# Patient Record
Sex: Male | Born: 1990
Health system: Southern US, Community
[De-identification: ages and names within clinical notes are randomized; demographics above are authoritative.]

## PROBLEM LIST (undated history)

## (undated) DIAGNOSIS — F419 Anxiety disorder, unspecified: Secondary | ICD-10-CM

## (undated) DIAGNOSIS — F32A Depression, unspecified: Secondary | ICD-10-CM

## (undated) DIAGNOSIS — F329 Major depressive disorder, single episode, unspecified: Secondary | ICD-10-CM

## (undated) DIAGNOSIS — K219 Gastro-esophageal reflux disease without esophagitis: Secondary | ICD-10-CM

## (undated) HISTORY — DX: Gastro-esophageal reflux disease without esophagitis: K21.9

---

## 2015-04-13 ENCOUNTER — Encounter (HOSPITAL_COMMUNITY): Payer: Self-pay | Admitting: *Deleted

## 2015-04-13 ENCOUNTER — Inpatient Hospital Stay (HOSPITAL_COMMUNITY)
Admission: AD | Admit: 2015-04-13 | Discharge: 2015-04-16 | DRG: 885 | Disposition: A | Discharge status: Home / Self Care | Payer: No Typology Code available for payment source | Source: Intra-hospital | Attending: Psychiatry | Admitting: Psychiatry

## 2015-04-13 ENCOUNTER — Encounter (HOSPITAL_COMMUNITY): Payer: Self-pay | Admitting: Emergency Medicine

## 2015-04-13 ENCOUNTER — Emergency Department (HOSPITAL_COMMUNITY)
Admission: EM | Admit: 2015-04-13 | Discharge: 2015-04-13 | Disposition: A | Discharge status: Other Acute Inpatient Hospital | Payer: Self-pay | Attending: Emergency Medicine | Admitting: Emergency Medicine

## 2015-04-13 DIAGNOSIS — F1210 Cannabis abuse, uncomplicated: Secondary | ICD-10-CM | POA: Insufficient documentation

## 2015-04-13 DIAGNOSIS — F329 Major depressive disorder, single episode, unspecified: Secondary | ICD-10-CM | POA: Insufficient documentation

## 2015-04-13 DIAGNOSIS — F1310 Sedative, hypnotic or anxiolytic abuse, uncomplicated: Secondary | ICD-10-CM | POA: Insufficient documentation

## 2015-04-13 DIAGNOSIS — F32A Depression, unspecified: Secondary | ICD-10-CM

## 2015-04-13 DIAGNOSIS — Z720 Tobacco use: Secondary | ICD-10-CM | POA: Insufficient documentation

## 2015-04-13 DIAGNOSIS — F1290 Cannabis use, unspecified, uncomplicated: Secondary | ICD-10-CM | POA: Diagnosis present

## 2015-04-13 DIAGNOSIS — F319 Bipolar disorder, unspecified: Secondary | ICD-10-CM | POA: Diagnosis present

## 2015-04-13 DIAGNOSIS — R45851 Suicidal ideations: Secondary | ICD-10-CM | POA: Diagnosis present

## 2015-04-13 DIAGNOSIS — F419 Anxiety disorder, unspecified: Secondary | ICD-10-CM | POA: Insufficient documentation

## 2015-04-13 DIAGNOSIS — F314 Bipolar disorder, current episode depressed, severe, without psychotic features: Secondary | ICD-10-CM | POA: Diagnosis not present

## 2015-04-13 DIAGNOSIS — F332 Major depressive disorder, recurrent severe without psychotic features: Secondary | ICD-10-CM | POA: Diagnosis present

## 2015-04-13 DIAGNOSIS — F17210 Nicotine dependence, cigarettes, uncomplicated: Secondary | ICD-10-CM | POA: Diagnosis present

## 2015-04-13 DIAGNOSIS — F3130 Bipolar disorder, current episode depressed, mild or moderate severity, unspecified: Secondary | ICD-10-CM | POA: Diagnosis not present

## 2015-04-13 HISTORY — DX: Depression, unspecified: F32.A

## 2015-04-13 HISTORY — DX: Major depressive disorder, single episode, unspecified: F32.9

## 2015-04-13 HISTORY — DX: Anxiety disorder, unspecified: F41.9

## 2015-04-13 LAB — COMPREHENSIVE METABOLIC PANEL
ALBUMIN: 3.9 g/dL (ref 3.5–5.0)
ALT: 12 U/L — ABNORMAL LOW (ref 17–63)
ANION GAP: 5 (ref 5–15)
AST: 16 U/L (ref 15–41)
Alkaline Phosphatase: 52 U/L (ref 38–126)
BUN: 9 mg/dL (ref 6–20)
CO2: 27 mmol/L (ref 22–32)
CREATININE: 1.08 mg/dL (ref 0.61–1.24)
Calcium: 8.9 mg/dL (ref 8.9–10.3)
Chloride: 106 mmol/L (ref 101–111)
GFR calc non Af Amer: >60 mL/min (ref >60)
GLUCOSE: 102 mg/dL — AB (ref 65–99)
Potassium: 3.6 mmol/L (ref 3.5–5.1)
Sodium: 138 mmol/L (ref 135–145)
TOTAL PROTEIN: 6.2 g/dL — AB (ref 6.5–8.1)
Total Bilirubin: 0.8 mg/dL (ref 0.3–1.2)

## 2015-04-13 LAB — CBC
HCT: 42.7 % (ref 39.0–52.0)
Hemoglobin: 14.6 g/dL (ref 13.0–17.0)
MCH: 30.7 pg (ref 26.0–34.0)
MCHC: 34.2 g/dL (ref 30.0–36.0)
MCV: 89.7 fL (ref 78.0–100.0)
Platelets: 177 10*3/uL (ref 150–400)
RBC: 4.76 MIL/uL (ref 4.22–5.81)
RDW: 13.2 % (ref 11.5–15.5)
WBC: 5.7 10*3/uL (ref 4.0–10.5)

## 2015-04-13 LAB — RAPID URINE DRUG SCREEN, HOSP PERFORMED
Amphetamines: NOT DETECTED
BARBITURATES: NOT DETECTED
Benzodiazepines: POSITIVE — AB
Cocaine: NOT DETECTED
Opiates: NOT DETECTED
Tetrahydrocannabinol: POSITIVE — AB

## 2015-04-13 LAB — ETHANOL: Alcohol, Ethyl (B): <5 mg/dL (ref <5)

## 2015-04-13 LAB — ACETAMINOPHEN LEVEL: Acetaminophen (Tylenol), Serum: <10 ug/mL — ABNORMAL LOW (ref 10–30)

## 2015-04-13 LAB — SALICYLATE LEVEL: Salicylate Lvl: <4 mg/dL (ref 2.8–30.0)

## 2015-04-13 MED ORDER — ENSURE ENLIVE PO LIQD: 237.0000 mL | Freq: Two times a day (BID) | ORAL | Status: DC

## 2015-04-13 MED ORDER — ACETAMINOPHEN 325 MG PO TABS: 650.0000 mg | ORAL_TABLET | Freq: Four times a day (QID) | ORAL | Status: DC | PRN

## 2015-04-13 MED ORDER — ALUM & MAG HYDROXIDE-SIMETH 200-200-20 MG/5ML PO SUSP: 30.0000 mL | ORAL | Status: DC | PRN

## 2015-04-13 MED ORDER — TEMAZEPAM 15 MG PO CAPS
15.0000 mg | ORAL_CAPSULE | Freq: Every evening | ORAL | Status: DC | PRN
  Administered 2015-04-14 – 2015-04-15 (×2): 15 mg via ORAL
  Filled 2015-04-13 (×2): qty 1

## 2015-04-13 MED ORDER — MAGNESIUM HYDROXIDE 400 MG/5ML PO SUSP: 30.0000 mL | Freq: Every day | ORAL | Status: DC | PRN

## 2015-04-13 MED ORDER — HYDROXYZINE HCL 25 MG PO TABS
25.0000 mg | ORAL_TABLET | Freq: Four times a day (QID) | ORAL | Status: DC | PRN
  Administered 2015-04-14: 25 mg via ORAL
  Filled 2015-04-13: qty 1

## 2015-04-13 MED ORDER — NICOTINE 21 MG/24HR TD PT24
21.0000 mg | MEDICATED_PATCH | TRANSDERMAL | Status: DC
  Filled 2015-04-13 (×3): qty 1

## 2015-04-13 MED ORDER — TRAZODONE HCL 50 MG PO TABS
50.0000 mg | ORAL_TABLET | Freq: Every evening | ORAL | Status: DC | PRN
  Administered 2015-04-14: 50 mg via ORAL
  Filled 2015-04-13: qty 1

## 2015-04-13 NOTE — Progress Notes (Signed)
Patient ID: Martin Stephens, male   DOB: [birth year], 24 y.o.   MRN: [id number] Admission:   Patient admitted for depression, SI and THC addiction. Pt [place] and felt like [place]. Pt reports no previous suicide attempts. Reports daily use of two bowls of marijuana which helps with  anxiety and sleep. Pt denies any previous mental health or SA treatment. Pt stated current stressors are conflict with his girlfriend, financial, work stress, and poor family support.Patient states he has anger issues when provoked and stated  'I will go after someone if they strike me wrong. I have had 5 assault charges". Also reports property destruction. Patient pleasant during admission. Affect depressed. Patient reports crying spells.Patient oriented to unit. Nourishment offered.

## 2015-04-13 NOTE — ED Notes (Signed)
Pt. wanded by security, staffing called for sitter, not availiable until 1100. Changed into scrubs.

## 2015-04-13 NOTE — Progress Notes (Signed)
Adult Psychoeducational Group Note  Date:  04/13/2015 Time:  9:18 PM  Group Topic/Focus:    Participation Level:    Participation Quality:    Affect:    Cognitive:    Insight:   Engagement in Group:    Modes of Intervention:    Additional Comments:  Patient did not attend group.  Natasha Mead 04/13/2015, 9:18 PM

## 2015-04-13 NOTE — ED Provider Notes (Signed)
CSN: [id number]     Arrival date & time 04/13/15  4098 History   First MD Initiated Contact with Patient 04/13/15 [phone]     Chief Complaint  Patient presents with  . Suicidal  . Depression      HPI  Patient presents for evaluation of depression and suicidal thoughts after [place]  He reports a history of anger control issues dating back to high school". He states he's been more labile with just the last few months. Has been more depressed. States she is normally considerable stress about work, money, and his relationship. He is a long-term relationship with his daughters mother. Works as a Games developer. He states he wanted to try to take today yesterday to "get away". [place]. He got up to the ledge. States he did not drive to the intent of hurting himself. However, while there he considered jumping in thinking "would end it all it might not hurt". He presents here for evaluation. He has never been evaluated or treated for mental health disorders including depression or anxiety.  History reviewed. No pertinent past medical history. History reviewed. No pertinent past surgical history. No family history on file. History  Substance Use Topics  . Smoking status: Current Every Day Smoker  . Smokeless tobacco: Not on file  . Alcohol Use: No    Review of Systems  Constitutional: Negative for fever, chills, diaphoresis, appetite change and fatigue.  HENT: Negative for mouth sores, sore throat and trouble swallowing.   Eyes: Negative for visual disturbance.  Respiratory: Negative for cough, chest tightness, shortness of breath and wheezing.   Cardiovascular: Negative for chest pain.  Gastrointestinal: Negative for nausea, vomiting, abdominal pain, diarrhea and abdominal distention.  Endocrine: Negative for polydipsia, polyphagia and polyuria.  Genitourinary: Negative for dysuria, frequency and  hematuria.  Musculoskeletal: Negative for gait problem.  Skin: Negative for color change, pallor and rash.  Neurological: Negative for dizziness, syncope, light-headedness and headaches.  Hematological: Does not bruise/bleed easily.  Psychiatric/Behavioral: Positive for suicidal ideas and dysphoric mood. Negative for behavioral problems and confusion. The patient is nervous/anxious.       Allergies  Review of patient's allergies indicates no known allergies.  Home Medications   Prior to Admission medications   Medication Sig Start Date End Date Taking? Authorizing Provider  Vitamins A & D (VITAMIN A & D) ointment Apply 1 application topically as needed for dry skin (tattoo care).   Yes Historical Provider, MD   BP 105/63 mmHg  Pulse 60  Temp(Src) 97.9 F (36.6 C) (Oral)  Resp 18  Ht 6' (1.829 m)  Wt 175 lb (79.379 kg)  BMI 23.73 kg/m2  SpO2 98% Physical Exam  Constitutional: He is oriented to person, place, and time. He appears well-developed and well-nourished. No distress.  HENT:  Head: Normocephalic.  Eyes: Conjunctivae are normal. Pupils are equal, round, and reactive to light. No scleral icterus.  Neck: Normal range of motion. Neck supple. No thyromegaly present.  Cardiovascular: Normal rate and regular rhythm.  Exam reveals no gallop and no friction rub.   No murmur heard. Pulmonary/Chest: Effort normal and breath sounds normal. No respiratory distress. He has no wheezes. He has no rales.  Abdominal: Soft. Bowel sounds are normal. He exhibits no distension. There is no tenderness. There is no rebound.  Musculoskeletal: Normal range of motion.  Neurological: He is alert and oriented to person, place, and  time.  Skin: Skin is warm and dry. No rash noted.  Psychiatric: He has a normal mood and affect. His behavior is normal.    ED Course  Procedures (including critical care time) Labs Review Labs Reviewed  COMPREHENSIVE METABOLIC PANEL - Abnormal; Notable for the  following:    Glucose, Bld 102 (*)    Total Protein 6.2 (*)    ALT 12 (*)    All other components within normal limits  ACETAMINOPHEN LEVEL - Abnormal; Notable for the following:    Acetaminophen (Tylenol), Serum <10 (*)    All other components within normal limits  ETHANOL  SALICYLATE LEVEL  CBC  URINE RAPID DRUG SCREEN, HOSP PERFORMED    Imaging Review No results found.   EKG Interpretation None      MDM   Final diagnoses:  Depression    Patient undergoing TTS evaluation by [facility] staff currently.    Rolland Porter, MD 04/13/15 1005

## 2015-04-13 NOTE — Tx Team (Signed)
Initial Interdisciplinary Treatment Plan   PATIENT STRESSORS: Financial difficulties Legal issue Substance abuse   PATIENT STRENGTHS: Ability for insight Capable of independent living Communication skills General fund of knowledge Physical Health   PROBLEM LIST: Problem List/Patient Goals Date to be addressed Date deferred Reason deferred Estimated date of resolution  "I came here because I need help, to talk" 04/16/15     "I am a pot head but I don't want to quit"                                                 DISCHARGE CRITERIA:  Ability to meet basic life and health needs Improved stabilization in mood, thinking, and/or behavior Motivation to continue treatment in a less acute level of care  PRELIMINARY DISCHARGE PLAN: Return to previous living arrangement Return to previous work or school arrangements  PATIENT/FAMIILY INVOLVEMENT: This treatment plan has been presented to and reviewed with the patient, Martin Stephens.  The patient has been given the opportunity to ask questions and make suggestions.  Loren Racer 04/13/2015, 3:58 PM

## 2015-04-13 NOTE — BH Assessment (Addendum)
Tele Assessment Note   Martin Stephens is an 24 y.o. male that is self-referred to [facility] this day.  Pt stated he has been having increased depression and SI with attempt.  Pt stated he went [place] yesterday, did not have intent to jump, but stated he contemplated jumping.  Pt reports no previous SI or attempts.  Pt continues to endorse SI and was crying throughout assessment.  Pt reports depressive sx including poor sleep, not eating in three days, problems functioning at work, increased anger and irritability, sadness, crying spells.  Pt reports anxiety, stating he has "constant worry about my family" and reports he shakes at times.  Pt denies HI or AVH.  No delusions noted.  Pt does report ongoig daily use of marijuana to help with anxiety and sleep.  Pt denies any previous mental health or SA treatment.  Pt stated current stressors are conflict with his girlfriend, financial, work stress, and poor family support.  Pt cooperative, crying throughout assessment, had good eye contact, was pleasant, oriented x 4, had logical/coherent thought processes, normal speech, good eye contact, depressed mood, appropriate affect.  Inpatient psychiatric treatment is recommended for the pt at this time.  Consulted with Dr. Lucianne Muss who accepted pt to [facility] pending bed availability.  Updated TTS staff and EDP Fayrene Fearing, who was in agreement with pt disposition.  Pt is voluntary and wants help.  Axis I: 296.33 Major Depressive Disorder, Recurrent, Severe, 304.30 Cannabis use disorder, Severe Axis II: Deferred Axis III:  Past Medical History  Diagnosis Date  . Depression   . Anxiety    Axis IV: occupational problems, other psychosocial or environmental problems, problems related to social environment, problems with access to health care services and problems with primary support group Axis V: 21-30 behavior considerably influenced by delusions or hallucinations OR serious impairment in judgment, communication OR  inability to function in almost all areas  Past Medical History:  Past Medical History  Diagnosis Date  . Depression   . Anxiety     History reviewed. No pertinent past surgical history.  Family History: No family history on file.  Social History:  reports that he has been smoking.  He does not have any smokeless tobacco history on file. He reports that he uses illicit drugs (Marijuana). He reports that he does not drink alcohol.  Additional Social History:  Alcohol / Drug Use Pain Medications: none Prescriptions: none Over the Counter: none History of alcohol / drug use?: Yes Longest period of sobriety (when/how long): na Negative Consequences of Use:  (na-pt denies) Withdrawal Symptoms:  (na) Substance #1 Name of Substance 1: Marijuana 1 - Age of First Use: 6th grade 1 - Amount (size/oz): 5 grams 1 - Frequency: daily 1 - Duration: ongoing 1 - Last Use / Amount: today-2 bowls  CIWA: CIWA-Ar BP: 105/63 mmHg Pulse Rate: 60 COWS:    PATIENT STRENGTHS: (choose at least two) Ability for insight Average or above average intelligence Capable of independent living Communication skills General fund of knowledge Motivation for treatment/growth Physical Health Work skills  Allergies: No Known Allergies  Home Medications:  (Not in a hospital admission)  OB/GYN Status:  No LMP for male patient.  General Assessment Data Location of Assessment: [facility] Services TTS Assessment: In system Is this a Tele or Face-to-Face Assessment?: Tele Assessment Is this an Initial Assessment or a Re-assessment for this encounter?: Initial Assessment Marital status: Single Maiden name:  (na) Is patient pregnant?:  (na) Pregnancy Status:  (  na) Living Arrangements: Spouse/significant other, Children Can pt return to current living arrangement?: Yes Admission Status: Voluntary Is patient capable of signing voluntary admission?: Yes Referral Source: Self/Family/Friend Insurance  type: None  Medical Screening Exam [facility] Walk-in ONLY) Medical Exam completed: No Reason for MSE not completed:  (na)  Crisis Care Plan Living Arrangements: Spouse/significant other, Children Name of Psychiatrist: none Name of Therapist: none  Education Status Is patient currently in school?: No Current Grade: na Highest grade of school patient has completed: college degree Name of school: na Contact person: na  Risk to self with the past 6 months Suicidal Ideation: Yes-Currently Present Has patient been a risk to self within the past 6 months prior to admission? : Yes Suicidal Intent: Yes-Currently Present Has patient had any suicidal intent within the past 6 months prior to admission? : Yes Is patient at risk for suicide?: Yes Suicidal Plan?: Yes-Currently Present Has patient had any suicidal plan within the past 6 months prior to admission? : Yes Specify Current Suicidal Plan: was going to [place] yesterday Access to Means: Yes Specify Access to Suicidal Means: was able [place] What has been your use of drugs/alcohol within the last 12 months?: pt report daily use of marijuana Previous Attempts/Gestures: No How many times?: 0 Other Self Harm Risks: na-pt denies Triggers for Past Attempts: None known Intentional Self Injurious Behavior: None Family Suicide History: No Recent stressful life event(s): Conflict (Comment), Financial Problems Persecutory voices/beliefs?: No Depression: Yes Depression Symptoms: Despondent, Insomnia, Tearfulness, Isolating, Fatigue, Loss of interest in usual pleasures, Feeling worthless/self pity, Feeling angry/irritable Substance abuse history and/or treatment for substance abuse?: No Suicide prevention information given to non-admitted patients: Not applicable  Risk to Others within the past 6 months Homicidal Ideation: No Does patient have any lifetime risk of violence toward others beyond the six months  prior to admission? : No Thoughts of Harm to Others: No Current Homicidal Intent: No Current Homicidal Plan: No Access to Homicidal Means: No Identified Victim: na-pt denies History of harm to others?: No Assessment of Violence: None Noted Violent Behavior Description: na-pt cooperative Does patient have access to weapons?: No Criminal Charges Pending?: No Does patient have a court date: No Is patient on probation?: No  Psychosis Hallucinations: None noted Delusions: None noted  Mental Status Report Appearance/Hygiene: Disheveled, In scrubs Eye Contact: Good Motor Activity: Freedom of movement, Unremarkable Speech: Logical/coherent Level of Consciousness: Quiet/awake, Crying Mood: Depressed Affect: Appropriate to circumstance Anxiety Level: Severe Thought Processes: Coherent, Relevant Judgement: Impaired Orientation: Person, Place, Time, Situation Obsessive Compulsive Thoughts/Behaviors: None  Cognitive Functioning Concentration: Decreased Memory: Recent Intact, Remote Intact IQ: Average Insight: Fair Impulse Control: Fair Appetite: Poor Weight Loss:  (unknown) Weight Gain: 0 Sleep: Decreased Total Hours of Sleep: 4 Vegetative Symptoms: None  ADLScreening [facility] Assessment Services) Patient's cognitive ability adequate to safely complete daily activities?: Yes Patient able to express need for assistance with ADLs?: Yes Independently performs ADLs?: Yes (appropriate for developmental age)  Prior Inpatient Therapy Prior Inpatient Therapy: No Prior Therapy Dates: na Prior Therapy Facilty/Provider(s): na Reason for Treatment: na  Prior Outpatient Therapy Prior Outpatient Therapy: No Prior Therapy Dates: na Prior Therapy Facilty/Provider(s): na Reason for Treatment: na Does patient have an ACCT team?: No Does patient have Intensive In-House Services?  : No Does patient have [facility] services? : No Does patient have P4CC services?: No  ADL Screening (condition  at time of admission) Patient's cognitive ability adequate to safely complete  daily activities?: Yes Is the patient deaf or have difficulty hearing?: No Does the patient have difficulty seeing, even when wearing glasses/contacts?: No Does the patient have difficulty concentrating, remembering, or making decisions?: No Patient able to express need for assistance with ADLs?: Yes Does the patient have difficulty dressing or bathing?: No Independently performs ADLs?: Yes (appropriate for developmental age) Does the patient have difficulty walking or climbing stairs?: No  Home Assistive Devices/Equipment Home Assistive Devices/Equipment: None    Abuse/Neglect Assessment (Assessment to be complete while patient is alone) Physical Abuse: Denies Verbal Abuse: Denies Sexual Abuse: Denies Exploitation of patient/patient's resources: Denies Self-Neglect: Denies Values / Beliefs Cultural Requests During Hospitalization: None Spiritual Requests During Hospitalization: None Consults Spiritual Care Consult Needed: No Social Work Consult Needed: No Merchant navy officer (For Healthcare) Does patient have an advance directive?: No Would patient like information on creating an advanced directive?: No - patient declined information    Additional Information 1:1 In Past 12 Months?: No CIRT Risk: No Elopement Risk: No Does patient have medical clearance?: Yes     Disposition:  Disposition Initial Assessment Completed for this Encounter: Yes Disposition of Patient: Referred to, Inpatient treatment program Type of inpatient treatment program: Adult  Casimer Lanius, MS, [facility] Therapeutic Triage Specialist [facility]   04/13/2015 10:37 AM

## 2015-04-13 NOTE — BH Assessment (Signed)
BHH Assessment Progress Note   Called and scheduled pt's tele assessment with this clinician as well as gathered clinical information from EDP James.  Casimer Lanius, MS, [facility] Therapeutic Triage Specialist [facility]

## 2015-04-13 NOTE — Progress Notes (Signed)
Patient has been accepted to [facility] to Dr. Adela Glimpse to Bed 400-2 Patient can be transported by [facility] after 2pm Report to be called:  [phone]  Patient needs to sign voluntary paperwork!  Deretha Emory, MSW Clinical Social Work: Emergency Room [phone]

## 2015-04-13 NOTE — ED Notes (Signed)
Secretary called states TTS is ready and to place computer in room.

## 2015-04-13 NOTE — Progress Notes (Signed)
Pt has been very agitated this evening since being brought on the unit earlier this afternoon.  When writer first spoke to pt, he was in the dayroom sitting off to himself with his arms crossed, watching TV.  Writer spent some time building a rapport with pt, talking about his situation at home and about being at [facility]  Writer took the time to explain the process of admission and discharge.  Pt very upset about being here and feels he was lied to at the ED.  He says his infant child is sick at home and his girlfriend is at work.  His mother is keeping the baby, but he does not feel his mother is good with the baby as she is bipolar and moody.  He says his father is also bipolar.  He is afraid he is developing a lot of their traits and he has a lot of anger issues that he is not proud of.  He denies having any suicidal thoughts at this time.  He denies HI/AVH.  He says he just needs to get out of here and return home.  As we talked, pt seemed to settle down.  He went to his room and lay down to rest.  That lasted for a short while until pt came out to make a phone call.  Apparently the person he was calling did not answer and he became very irate, slammed the phone, and began cursing, saying he needed to get out of here.  He became very loud in his cursing and was pacing back and forth.  This Clinical research associate and CN spoke with pt, trying to calm him down.  He remained angry and stormed off to his room.  Security and the [facility] were called to come speak to pt.  When staff entered his room, he was sitting on the bed looking out the window crying.  Staff continued to de-escalate pt and apologize for the misunderstanding of the admission process that he was told at the ED.  Writer continued to talk with pt after the other staff left the room.  Pt was offered something to help him relax and go to sleep.  He declined, but Clinical research associate called the NP to get something prn in case pt changed his mind.  At this time, pt is in bed asleep.  Safety  maintained with q15 minute checks.

## 2015-04-13 NOTE — Progress Notes (Signed)
Patient requested to sign a 72-hour discharge.  He was given the form and the process was explained to him.  Patient became angry and stated, "no matter what I have to stay here 3 days.  I just want to leave!"  Patient walked off to his room.  He returned to desk and asked for a nicotine patch.  Then he changed his mind and stated he wanted to take it after his shower.  Patient had towels and toiletries in his hand.  He stated, "there is going to be a problem if I don't get out of here."  Patient threw the towels and toiletries through the window and stormed off to his room.  Counselor and [facility] currently in room to talk with patient.

## 2015-04-13 NOTE — Progress Notes (Signed)
Per Julieanne Cotton, pt accepted to [facility] bed 400-2 by Dr. Jama Flavors. Can arrive after 2pm today. Admission is voluntary.  Ilean Skill, MSW, LCSWA Clinical Social Work, Disposition  04/13/2015 [phone]

## 2015-04-13 NOTE — ED Notes (Signed)
Pt ambulatory with steady gait on departure. VSS. A/O x4. No complaints. Leaving with [facility] transporter. Pt belongings with transporter.

## 2015-04-13 NOTE — ED Notes (Signed)
Placed computer in room for TTS.

## 2015-04-13 NOTE — ED Notes (Signed)
Pt. Requested that RN call his mother to update on his admission and status. Pt. Provided phone number. Mothers number added as emergency contact in demographics. Mother updated and made aware of visiting hours.

## 2015-04-13 NOTE — ED Notes (Signed)
Pt. Stated, my depression started about 4 months ago and it really got bad about 3 weeks ago.  Im depressed and yesterday i just wanted all of it to go away. My mind was just wondering, [place] for about 4 hours and I thought I could just jump and it wouldn't hurt.

## 2015-04-14 DIAGNOSIS — F314 Bipolar disorder, current episode depressed, severe, without psychotic features: Secondary | ICD-10-CM

## 2015-04-14 DIAGNOSIS — F3130 Bipolar disorder, current episode depressed, mild or moderate severity, unspecified: Secondary | ICD-10-CM | POA: Diagnosis present

## 2015-04-14 DIAGNOSIS — R45851 Suicidal ideations: Secondary | ICD-10-CM

## 2015-04-14 MED ORDER — LAMOTRIGINE 25 MG PO TABS
25.0000 mg | ORAL_TABLET | Freq: Every day | ORAL | Status: DC
  Administered 2015-04-14 – 2015-04-16 (×3): 25 mg via ORAL
  Filled 2015-04-14 (×6): qty 1

## 2015-04-14 MED ORDER — NICOTINE 21 MG/24HR TD PT24
21.0000 mg | MEDICATED_PATCH | TRANSDERMAL | Status: DC
  Filled 2015-04-14 (×5): qty 1

## 2015-04-14 NOTE — B[facility] Group Notes (Signed)
[facility] Group Notes:  (Nursing/MHT/Case Management/Adjunct)  Date:  04/14/2015  Time:  11:04 PM  Type of Therapy:  Psychoeducational Skills  Participation Level:  Minimal  Participation Quality:  Appropriate  Affect:  Flat  Cognitive:  Alert and Oriented  Insight:  Lacking  Engagement in Group:  Limited  Modes of Intervention:  Discussion and Education  Summary of Progress/Problems: Pt. States he is working on his anger and has no triggers just anything causes him to get angry but was resistant to coping skills.  States he just wants outpatient therapy.  Cooper Render 04/14/2015, 11:04 PM

## 2015-04-14 NOTE — [facility] Suicide Risk Assessment (Addendum)
[facility] Admission Suicide Risk Assessment   Nursing information obtained from:  Patient Demographic factors:  Male, Adolescent or young adult, Caucasian Current Mental Status:  Suicidal ideation indicated by patient Loss Factors:  Legal issues, Financial problems / change in socioeconomic status Historical Factors:  Family history of mental illness or substance abuse Risk Reduction Factors:  Responsible for children under 55 years of age, Sense of responsibility to family, Employed, Living with another person, especially a relative Total Time spent with patient: 45 minutes Principal Problem: Bipolar disorder Diagnosis:   Patient Active Problem List   Diagnosis Date Noted  . MDD (major depressive disorder), recurrent episode, severe [F33.2] 04/13/2015     Continued Clinical Symptoms:  Alcohol Use Disorder Identification Test Final Score (AUDIT): 0 The "Alcohol Use Disorders Identification Test", Guidelines for Use in Primary Care, Second Edition.  World Science writer [facility]). Score between 0-7:  no or low risk or alcohol related problems. Score between 8-15:  moderate risk of alcohol related problems. Score between 16-19:  high risk of alcohol related problems. Score 20 or above:  warrants further diagnostic evaluation for alcohol dependence and treatment.   CLINICAL FACTORS:   Bipolar Disorder:   Mixed State Depressive phase Unstable or Poor Therapeutic Relationship   Musculoskeletal: Strength & Muscle Tone: within normal limits Gait & Station: normal Patient leans: N/A  Psychiatric Specialty Exam: Physical Exam  ROS  Blood pressure 107/69, pulse 74, temperature 98.1 F (36.7 C), temperature source Oral, resp. rate 18, height 6\' 1"  (1.854 m), weight 75.751 kg (167 lb).Body mass index is 22.04 kg/(m^2).  General Appearance: Guarded  Eye Contact::  Fair  Speech:  Pressured  Volume:  Increased  Mood:  Angry, Anxious, Depressed and Irritable  Affect:  Constricted,  Depressed and Labile  Thought Process:  Intact  Orientation:  Full (Time, Place, and Person)  Thought Content:  Rumination  Suicidal Thoughts:  Yes.  with intent/plan  Homicidal Thoughts:  No  Memory:  Immediate;   Fair Recent;   Fair Remote;   Fair  Judgement:  Impaired  Insight:  Lacking  Psychomotor Activity:  Increased  Concentration:  Fair  Recall:  [employer] of Knowledge:Fair  Language: Good  Akathisia:  No  Handed:  Right  AIMS (if indicated):     Assets:  Financial Resources/Insurance Housing  Sleep:     Cognition: WNL  ADL's:  Intact     COGNITIVE FEATURES THAT CONTRIBUTE TO RISK:  Closed-mindedness, Loss of executive function and Polarized thinking    SUICIDE RISK:   Moderate:  Frequent suicidal ideation with limited intensity, and duration, some specificity in terms of plans, no associated intent, good self-control, limited dysphoria/symptomatology, some risk factors present, and identifiable protective factors, including available and accessible social support.  PLAN OF CARE: Patient is 24 year old Caucasian employed male who was admitted due to severe depression and having thoughts to kill himself.  Patient has plan to jump from hanging rock and he was contemplating to act on his plan.  Patient is irritable, labile and endorse that he has history of mood swings anger and agitation.  He reported a lot of psychosocial stressors including dealing with lawyer for custody battle for her girlfriend and financial stress.  Patient endorse father has bipolar disorder.  Staff informed the girlfriend is very concerned because patient sent text message about suicide.  Patient requires inpatient treatment and stabilization.  We will increase collateral information and consider mood stabilizer to help his anger and depression.  Patient  is positive for marijuana.  Medical Decision Making:  New problem, with additional work up planned, Review of Psycho-Social Stressors (1), Review or  order clinical lab tests (1), Review and summation of old records (2), Established Problem, Worsening (2), Review of Medication Regimen & Side Effects (2) and Review of New Medication or Change in Dosage (2)  I certify that inpatient services furnished can reasonably be expected to improve the patient's condition.   Shea Swalley T. 04/14/2015, 1:12 PM
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
## 2015-04-14 NOTE — [facility] Group Notes (Signed)
[facility] LCSW Group Therapy  04/14/2015 1:15 PM  Type of Therapy:  Group Therapy  Participation Level:  Active  Participation Quality:  Appropriate  Affect:  Appropriate  Cognitive:  Appropriate  Insight:  Developing/Improving  Engagement in Therapy:  Engaged  Modes of Intervention:  Discussion, Exploration, Rapport Building, Socialization and Support  Summary of Progress/Problems: The main focus of today's process group was to learn how to use a decisional balance exercise to move forward in the Stages of Change, which were described and discussed. Motivational Interviewing and the whiteboard were utilized to help patients explore in depth the perceived benefits and costs of unhealthy coping techniques, as well as the benefits and costs of replacing that with a healthy coping skills.Martin Stephens shared need for follow up with therapist which he reports he is highly motivated to follow up with this time reporting "I know I need help with my anger and am finally willing to talk with someone. I can't keep this up, I lash out inappropriately over nothing all the time." Patient also identified with self medication [facility]) and reports it is not problematic at this time.  Clide Dales 04/14/2015, 2:48 PM

## 2015-04-14 NOTE — Progress Notes (Signed)
Patient ID: Martin Stephens, male   DOB: 12-31-1990, 24 y.o.   MRN: [id number] D: Patient remains angry and agitated.  He spoke with MD and NP this morning and was angry because he was told he would not leave today.  Nicotine patch offered to patient, along with medication for anxiety.  Patient refused both.  Patient's finance called to ask if she could visit early.  Fiance stated that patient has hx of bipolar in his family and she feels he may be suffering from it also.  States that he will not take any medication.  Patient states that he smokes pot every day to self medicate.  He has isolated to room, electing to not attend group or go to the cafeteria.  He has minimal contact with staff and peers.  Patient states, "I don't need to be here."  Patient also signed 72 hour request for discharge today at 1345. A: Continue to monitor medication management and MD orders.  Safety checks completed every 15 minutes per protocol.  Offer encouragement and support as needed. R: Patient remains isolative and resistant to treatment.

## 2015-04-14 NOTE — Plan of Care (Signed)
Problem: Alteration in mood & ability to function due to Goal: LTG-Pt reports reduction in suicidal thoughts (Patient reports reduction in suicidal thoughts and is able to verbalize a safety plan for whenever patient is feeling suicidal)  Outcome: Progressing Patient denies any suicidal thoughts upon admission.

## 2015-04-14 NOTE — Progress Notes (Signed)
D.  Pt in dayroom on approach, denies complaints at this time.  Asked when he would next get his mood stabilizer.  Pt stated he didn't sleep last night so he wants his bed time medications as soon as possible tonight.  Denies SI/HI/hallucinations at this time.  Positive for evening wrap up group, interacting appropriately with peers on the unit.  A.  Support and encouragement offered, answered questions pertaining to medications.  R.  Pt remains safe on unit, will continue to monitor.

## 2015-04-14 NOTE — H&P (Signed)
Psychiatric Admission Assessment Adult  Patient Identification: Martin Stephens MRN:  [id number] Date of Evaluation:  04/14/2015 Chief Complaint:  "I have never had suicide cross my mind before."  Principal Diagnosis: Bipolar I disorder, most recent episode depressed Diagnosis:   Patient Active Problem List   Diagnosis Date Noted  . Bipolar I disorder, most recent episode depressed [F31.30] 04/14/2015  . MDD (major depressive disorder), recurrent episode, severe [F33.2] 04/13/2015   History of Present Illness::     Martin Stephens is a 24 year old male who presented to [facility] for evaluation of depression and suicidal thoughts yesterday after [place]. He reported a history of anger control issues dating back to high school. He stated he's been more labile with just the last few months. Has been more depressed. Stated dhe is normally considerable stress about work, money, and his relationship. He is a long-term relationship with his daughters mother. He works as a Engineer, building services. He states he wanted to try to take  yesterday to "get away". He went to [employer] and walked. He got up to the ledge per his report. States he did not drive to the intent of hurting himself. However, while there he considered jumping in thinking "would end it all it might not hurt".  He has never been evaluated or treated for mental health disorders including depression or anxiety. Since arriving to the adult [facility] unit the patient has been requesting discharge. Patient is insistent that being "here is just making me worse. I need to be with my son. I made a promise to take him swimming. I can got to counseling and achieve what I need. I want someone to talk with. I normally self medicate with marijuana but recently I ran out of money. It's the only thing that makes me feel normal. No I was not going to jump. I just went there to relax. I did not go up there to  the rock. I'm so upset that I can't see my son while I am here. I am more agitated than depressed. I mean I came here voluntarily and did not expect to come here. Yes you can talk to my father or my fiance. But I'm not really close to any family." During the psychiatric assessment the patient was very argumentative and spent time defending his use of marijuana. Patient is irritable, labile and endorse that he has history of mood swings anger and agitation. He reported a lot of psychosocial stressors including dealing with lawyer for custody battle for her girlfriend and financial stress. Patient endorsed father has bipolar disorder.Per patient's permission his girlfriend Martin Stephens was contacted for collateral information. She expressed concern that the patient had strong intention to commit suicide. She reported that [name] sent a text that indicated he had no reason to live and that she responded by sending a picture of her and son. She reported extreme periods of mood lability and problems with anger. The girlfriend reported that the patient actually left work to go to [employer] for the purpose of suicide attempt. [name] has signed his 48 hour request for discharge. He was informed that the medication Lamictal would be started and that he would need to be monitored for at least 72 hours. He has no insight into taking prescribed medication but rather endorses his marijuana use. Patient's UDS is positive for marijuana and benzo. He reports his mother gave him a xanax before to relax.  Elements:  Location:  mood instability, suicidal thoughts . Quality:  angry outbursts, feeling hopeless, suicidal thoughts . Severity:  Severe. Timing:  Last few weeks. Duration:  Chronic. Context:  numerous stressors, family history Bipolar, marijuana abuse. Associated Signs/Symptoms: Depression Symptoms:  depressed mood, anhedonia, insomnia, feelings of worthlessness/guilt, difficulty  concentrating, hopelessness, recurrent thoughts of death, suicidal thoughts with specific plan, anxiety, loss of energy/fatigue, disturbed sleep, decreased appetite, (Hypo) Manic Symptoms:  Impulsivity, Irritable Mood, Labiality of Mood, Anxiety Symptoms:  Excessive Worry, Psychotic Symptoms:  Denies PTSD Symptoms: Negative Total Time spent with patient: 1 hour  Past Medical History:  Past Medical History  Diagnosis Date  . Depression   . Anxiety    History reviewed. No pertinent past surgical history. Family History: History reviewed. No pertinent family history. Social History:  History  Alcohol Use No     History  Drug Use  . Yes  . Special: Marijuana    History   Social History  . Marital Status: Single    Spouse Name: N/A  . Number of Children: N/A  . Years of Education: N/A   Social History Main Topics  . Smoking status: Current Every Day Smoker  . Smokeless tobacco: Not on file  . Alcohol Use: No  . Drug Use: Yes    Special: Marijuana  . Sexual Activity: Not on file   Other Topics Concern  . None   Social History Narrative   Additional Social History:    Pain Medications: none Prescriptions: none Over the Counter: none Longest period of sobriety (when/how long): na Negative Consequences of Use: Financial, Personal relationships, Work / Youth worker Withdrawal Symptoms: Irritability, Agitation Name of Substance 1: Marijuana 1 - Age of First Use: 6th grade 1 - Amount (size/oz): 5 grams 1 - Frequency: daily 1 - Duration: ongoing 1 - Last Use / Amount: today-2 bowls                   Musculoskeletal: Strength & Muscle Tone: within normal limits Gait & Station: normal Patient leans: N/A  Psychiatric Specialty Exam: Physical Exam  Constitutional:  Physical exam findings reviewed from the [facility] and I concur with no noted exceptions.   Psychiatric: His mood appears anxious. His affect is angry and labile. His speech is rapid and/or  pressured. He is agitated and hyperactive. Cognition and memory are normal. He expresses impulsivity. He expresses suicidal ideation.    Review of Systems  Constitutional: Negative.   HENT: Negative.   Eyes: Negative.   Respiratory: Negative.   Cardiovascular: Negative.   Gastrointestinal: Negative.   Genitourinary: Negative.   Musculoskeletal: Negative.   Skin: Negative.   Neurological: Negative.   Endo/Heme/Allergies: Negative.   Psychiatric/Behavioral: Positive for depression, suicidal ideas and substance abuse (Positive for marijuana ). Negative for hallucinations and memory loss. The patient is nervous/anxious and has insomnia.     Blood pressure 107/69, pulse 74, temperature 98.1 F (36.7 C), temperature source Oral, resp. rate 18, height '6\' 1"'  (1.854 m), weight 75.751 kg (167 lb).Body mass index is 22.04 kg/(m^2).  General Appearance: Casual  Eye Contact::  Fair  Speech:  Pressured  Volume:  Increased  Mood:  Angry, Anxious, Depressed and Irritable  Affect:  Labile  Thought Process:  Goal Directed and Intact  Orientation:  Full (Time, Place, and Person)  Thought Content:  Rumination  Suicidal Thoughts:  Yes.  with intent/plan  Homicidal Thoughts:  No  Memory:  Immediate;   Fair Recent;   Fair Remote;  Fair  Judgement:  Impaired  Insight:  Lacking  Psychomotor Activity:  Increased and Restlessness  Concentration:  Fair  Recall:  [employer] of Knowledge:Good  Language: Good  Akathisia:  No  Handed:  Right  AIMS (if indicated):     Assets:  Communication Skills Desire for Improvement Housing Intimacy Leisure Time Physical Health Resilience Vocational/Educational  ADL's:  Intact  Cognition: WNL  Sleep:      Risk to Self: Is patient at risk for suicide?: Yes What has been your use of drugs/alcohol within the last 12 months?: Patient smokes 5 grams per day. Patient stated he smokes when he starts to feel nervous. Risk to Others:   Prior Inpatient Therapy:    Prior Outpatient Therapy:    Alcohol Screening: 1. How often do you have a drink containing alcohol?: Never 9. Have you or someone else been injured as a result of your drinking?: No 10. Has a relative or friend or a doctor or another health worker been concerned about your drinking or suggested you cut down?: No Alcohol Use Disorder Identification Test Final Score (AUDIT): 0 Brief Intervention: AUDIT score less than 7 or less-screening does not suggest unhealthy drinking-brief intervention not indicated  Allergies:  No Known Allergies Lab Results:  Results for orders placed or performed during the hospital encounter of 04/13/15 (from the past 48 hour(s))  Comprehensive metabolic panel     Status: Abnormal   Collection Time: 04/13/15  9:06 AM  Result Value Ref Range   Sodium 138 135 - 145 mmol/L   Potassium 3.6 3.5 - 5.1 mmol/L   Chloride 106 101 - 111 mmol/L   CO2 27 22 - 32 mmol/L   Glucose, Bld 102 (H) 65 - 99 mg/dL   BUN 9 6 - 20 mg/dL   Creatinine, Ser 1.08 0.61 - 1.24 mg/dL   Calcium 8.9 8.9 - 10.3 mg/dL   Total Protein 6.2 (L) 6.5 - 8.1 g/dL   Albumin 3.9 3.5 - 5.0 g/dL   AST 16 15 - 41 U/L   ALT 12 (L) 17 - 63 U/L   Alkaline Phosphatase 52 38 - 126 U/L   Total Bilirubin 0.8 0.3 - 1.2 mg/dL   GFR calc non Af Amer >60 >60 mL/min   GFR calc Af Amer >60 >60 mL/min    Comment: (NOTE) The eGFR has been calculated using the CKD EPI equation. This calculation has not been validated in all clinical situations. eGFR's persistently <60 mL/min signify possible Chronic Kidney Disease.    Anion gap 5 5 - 15  Ethanol (ETOH)     Status: None   Collection Time: 04/13/15  9:06 AM  Result Value Ref Range   Alcohol, Ethyl (B) <5 <5 mg/dL    Comment:        LOWEST DETECTABLE LIMIT FOR SERUM ALCOHOL IS 5 mg/dL FOR MEDICAL PURPOSES ONLY   Salicylate level     Status: None   Collection Time: 04/13/15  9:06 AM  Result Value Ref Range   Salicylate Lvl <0.6 2.8 - 30.0 mg/dL   Acetaminophen level     Status: Abnormal   Collection Time: 04/13/15  9:06 AM  Result Value Ref Range   Acetaminophen (Tylenol), Serum <10 (L) 10 - 30 ug/mL    Comment:        THERAPEUTIC CONCENTRATIONS VARY SIGNIFICANTLY. A RANGE OF 10-30 ug/mL MAY BE AN EFFECTIVE CONCENTRATION FOR MANY PATIENTS. HOWEVER, SOME ARE BEST TREATED AT CONCENTRATIONS OUTSIDE THIS RANGE. ACETAMINOPHEN CONCENTRATIONS >  150 ug/mL AT 4 HOURS AFTER INGESTION AND >50 ug/mL AT 12 HOURS AFTER INGESTION ARE OFTEN ASSOCIATED WITH TOXIC REACTIONS.   CBC     Status: None   Collection Time: 04/13/15  9:06 AM  Result Value Ref Range   WBC 5.7 4.0 - 10.5 K/uL   RBC 4.76 4.22 - 5.81 MIL/uL   Hemoglobin 14.6 13.0 - 17.0 g/dL   HCT 42.7 39.0 - 52.0 %   MCV 89.7 78.0 - 100.0 fL   MCH 30.7 26.0 - 34.0 pg   MCHC 34.2 30.0 - 36.0 g/dL   RDW 13.2 11.5 - 15.5 %   Platelets 177 150 - 400 K/uL  Urine rapid drug screen (hosp performed) (Not at [facility])     Status: Abnormal   Collection Time: 04/13/15 11:27 AM  Result Value Ref Range   Opiates NONE DETECTED NONE DETECTED   Cocaine NONE DETECTED NONE DETECTED   Benzodiazepines POSITIVE (A) NONE DETECTED   Amphetamines NONE DETECTED NONE DETECTED   Tetrahydrocannabinol POSITIVE (A) NONE DETECTED   Barbiturates NONE DETECTED NONE DETECTED    Comment:        DRUG SCREEN FOR MEDICAL PURPOSES ONLY.  IF CONFIRMATION IS NEEDED FOR ANY PURPOSE, NOTIFY LAB WITHIN 5 DAYS.        LOWEST DETECTABLE LIMITS FOR URINE DRUG SCREEN Drug Class       Cutoff (ng/mL) Amphetamine      1000 Barbiturate      200 Benzodiazepine   478 Tricyclics       295 Opiates          300 Cocaine          300 THC              50    Current Medications: Current Facility-Administered Medications  Medication Dose Route Frequency Provider Last Rate Last Dose  . acetaminophen (TYLENOL) tablet 650 mg  650 mg Oral Q6H PRN Niel Hummer, NP      . alum & mag hydroxide-simeth (MAALOX/MYLANTA) 200-200-20  MG/5ML suspension 30 mL  30 mL Oral Q4H PRN Niel Hummer, NP      . feeding supplement (ENSURE ENLIVE) (ENSURE ENLIVE) liquid 237 mL  237 mL Oral BID BM Encarnacion Slates, NP   237 mL at 04/13/15 1717  . hydrOXYzine (ATARAX/VISTARIL) tablet 25 mg  25 mg Oral Q6H PRN Niel Hummer, NP      . lamoTRIgine (LAMICTAL) tablet 25 mg  25 mg Oral Daily Niel Hummer, NP      . magnesium hydroxide (MILK OF MAGNESIA) suspension 30 mL  30 mL Oral Daily PRN Niel Hummer, NP      . nicotine (NICODERM CQ - dosed in mg/24 hours) patch 21 mg  21 mg Transdermal Q24H Nicholaus Bloom, MD   21 mg at 04/14/15 0800  . temazepam (RESTORIL) capsule 15 mg  15 mg Oral QHS PRN Harriet Butte, NP      . traZODone (DESYREL) tablet 50 mg  50 mg Oral QHS PRN Niel Hummer, NP       PTA Medications: Prescriptions prior to admission  Medication Sig Dispense Refill Last Dose  . Vitamins A & D (VITAMIN A & D) ointment Apply 1 application topically as needed for dry skin (tattoo care).   04/12/2015 at Unknown time    Previous Psychotropic Medications: No   Substance Abuse History in the last 12 months:  Yes.   Patient reports daily use  of marijuana if he can afford it. His urine drug screen is positive for marijuana.    Consequences of Substance Abuse: Worsening of mood instability , depressive symptoms  Results for orders placed or performed during the hospital encounter of 04/13/15 (from the past 72 hour(s))  Comprehensive metabolic panel     Status: Abnormal   Collection Time: 04/13/15  9:06 AM  Result Value Ref Range   Sodium 138 135 - 145 mmol/L   Potassium 3.6 3.5 - 5.1 mmol/L   Chloride 106 101 - 111 mmol/L   CO2 27 22 - 32 mmol/L   Glucose, Bld 102 (H) 65 - 99 mg/dL   BUN 9 6 - 20 mg/dL   Creatinine, Ser 1.08 0.61 - 1.24 mg/dL   Calcium 8.9 8.9 - 10.3 mg/dL   Total Protein 6.2 (L) 6.5 - 8.1 g/dL   Albumin 3.9 3.5 - 5.0 g/dL   AST 16 15 - 41 U/L   ALT 12 (L) 17 - 63 U/L   Alkaline Phosphatase 52 38 - 126 U/L    Total Bilirubin 0.8 0.3 - 1.2 mg/dL   GFR calc non Af Amer >60 >60 mL/min   GFR calc Af Amer >60 >60 mL/min    Comment: (NOTE) The eGFR has been calculated using the CKD EPI equation. This calculation has not been validated in all clinical situations. eGFR's persistently <60 mL/min signify possible Chronic Kidney Disease.    Anion gap 5 5 - 15  Ethanol (ETOH)     Status: None   Collection Time: 04/13/15  9:06 AM  Result Value Ref Range   Alcohol, Ethyl (B) <5 <5 mg/dL    Comment:        LOWEST DETECTABLE LIMIT FOR SERUM ALCOHOL IS 5 mg/dL FOR MEDICAL PURPOSES ONLY   Salicylate level     Status: None   Collection Time: 04/13/15  9:06 AM  Result Value Ref Range   Salicylate Lvl <5.0 2.8 - 30.0 mg/dL  Acetaminophen level     Status: Abnormal   Collection Time: 04/13/15  9:06 AM  Result Value Ref Range   Acetaminophen (Tylenol), Serum <10 (L) 10 - 30 ug/mL    Comment:        THERAPEUTIC CONCENTRATIONS VARY SIGNIFICANTLY. A RANGE OF 10-30 ug/mL MAY BE AN EFFECTIVE CONCENTRATION FOR MANY PATIENTS. HOWEVER, SOME ARE BEST TREATED AT CONCENTRATIONS OUTSIDE THIS RANGE. ACETAMINOPHEN CONCENTRATIONS >150 ug/mL AT 4 HOURS AFTER INGESTION AND >50 ug/mL AT 12 HOURS AFTER INGESTION ARE OFTEN ASSOCIATED WITH TOXIC REACTIONS.   CBC     Status: None   Collection Time: 04/13/15  9:06 AM  Result Value Ref Range   WBC 5.7 4.0 - 10.5 K/uL   RBC 4.76 4.22 - 5.81 MIL/uL   Hemoglobin 14.6 13.0 - 17.0 g/dL   HCT 42.7 39.0 - 52.0 %   MCV 89.7 78.0 - 100.0 fL   MCH 30.7 26.0 - 34.0 pg   MCHC 34.2 30.0 - 36.0 g/dL   RDW 13.2 11.5 - 15.5 %   Platelets 177 150 - 400 K/uL  Urine rapid drug screen (hosp performed) (Not at [facility])     Status: Abnormal   Collection Time: 04/13/15 11:27 AM  Result Value Ref Range   Opiates NONE DETECTED NONE DETECTED   Cocaine NONE DETECTED NONE DETECTED   Benzodiazepines POSITIVE (A) NONE DETECTED   Amphetamines NONE DETECTED NONE DETECTED    Tetrahydrocannabinol POSITIVE (A) NONE DETECTED   Barbiturates NONE DETECTED NONE DETECTED  Comment:        DRUG SCREEN FOR MEDICAL PURPOSES ONLY.  IF CONFIRMATION IS NEEDED FOR ANY PURPOSE, NOTIFY LAB WITHIN 5 DAYS.        LOWEST DETECTABLE LIMITS FOR URINE DRUG SCREEN Drug Class       Cutoff (ng/mL) Amphetamine      1000 Barbiturate      200 Benzodiazepine   732 Tricyclics       202 Opiates          300 Cocaine          300 THC              50     Observation Level/Precautions:  15 minute checks  Laboratory:  CBC Chemistry Profile UDS  Psychotherapy:  Individual and Group Therapy  Medications:  Start Lamictal 25 mg daily for mood instability   Consultations:  As needed  Discharge Concerns:  Continued marijuana use, Medication compliance   Estimated LOS: 2-3 days   Other:  Obtain collateral information from fiance   Psychological Evaluations: Yes   Treatment Plan Summary: Daily contact with patient to assess and evaluate symptoms and progress in treatment and Medication management  Medical Decision Making:  New problem, with additional work up planned, Review of Psycho-Social Stressors (1), Review or order clinical lab tests (1), Review of Medication Regimen & Side Effects (2) and Review of New Medication or Change in Dosage (2)  I certify that inpatient services furnished can reasonably be expected to improve the patient's condition.   Elmarie Shiley NP-C 7/30/20164:57 PM  I have personally seen the patient and agreed with the findings and involved in the treatment plan. Patient is 24 year old Caucasian employed male who was admitted due to severe depression and having thoughts to kill himself. Patient has plan to jump from hanging rock and he was contemplating to act on his plan. Patient is irritable, labile and endorse that he has history of mood swings anger and agitation. He reported a lot of psychosocial stressors including dealing with lawyer for custody battle  for her girlfriend and financial stress. Patient endorse father has bipolar disorder. Staff informed the girlfriend is very concerned because patient sent text message about suicide. Patient requires inpatient treatment and stabilization. We will increase collateral information and consider mood stabilizer to help his anger and depression. Patient is positive for marijuana.

## 2015-04-15 MED ORDER — HYDROXYZINE HCL 50 MG PO TABS
50.0000 mg | ORAL_TABLET | Freq: Four times a day (QID) | ORAL | Status: DC | PRN
Start: 2015-04-15 — End: 2015-04-16
  Administered 2015-04-15 – 2015-04-16 (×2): 50 mg via ORAL
  Filled 2015-04-15 (×3): qty 1

## 2015-04-15 MED ORDER — TRAZODONE HCL 100 MG PO TABS
100.0000 mg | ORAL_TABLET | Freq: Every day | ORAL | Status: DC
  Administered 2015-04-15: 100 mg via ORAL
  Filled 2015-04-15 (×3): qty 1

## 2015-04-15 NOTE — Progress Notes (Signed)
[facility] MD Progress Note  04/15/2015 2:37 PM Martin Stephens  MRN:  [id number] Subjective:   Patient states "I am losing money being here in this hospital. I have a lot of responsibility outside of here. I took the medicine but have not felt an effect. I am still very anxious. I am always that way. I work all the time to support myself. I only sleep about a few hours a night because of my work schedule. I am hoping to leave tomorrow but know I can leave Tuesday because I signed the form".  Objective:  Patient is seen and chart is reviewed. The patient is more cooperative and calm today during the assessment today. He maintains that he is not suicidal and is focused on getting back to work. However, his girlfriend reported yesterday she was concerned that he had serious intent earlier in the week of self harm. The patient has taken two doses of Lamictal 25 mg so far with no reported adverse effects. Denies any rash and none observed. He complains of increased anxiety surrounding discharge but also in general. Reports past use of xanax for anxiety before "I started to abuse drugs." Is open to medications to treat symptoms that are non habit forming. No further behavioral outbursts reported on unit although the patient is resentful over inpatient admission. Patient unable to verbalize any benefits to inpatient stay and appears to have limited insight into symptoms.    Principal Problem: Bipolar I disorder, most recent episode depressed Diagnosis:   Patient Active Problem List   Diagnosis Date Noted  . Bipolar I disorder, most recent episode depressed [F31.30] 04/14/2015  . MDD (major depressive disorder), recurrent episode, severe [F33.2] 04/13/2015   Total Time spent with patient: 20 minutes  Past Medical History:  Past Medical History  Diagnosis Date  . Depression   . Anxiety    History reviewed. No pertinent past surgical history. Family History: History reviewed. No pertinent family  history. Per collateral information his father is reported to be treated for Bipolar Disorder  Social History:  History  Alcohol Use No     History  Drug Use  . Yes  . Special: Marijuana    History   Social History  . Marital Status: Single    Spouse Name: N/A  . Number of Children: N/A  . Years of Education: N/A   Social History Main Topics  . Smoking status: Current Every Day Smoker  . Smokeless tobacco: Not on file  . Alcohol Use: No  . Drug Use: Yes    Special: Marijuana  . Sexual Activity: Not on file   Other Topics Concern  . None   Social History Narrative   Additional History:    Sleep: Fair  Appetite:  Good  Assessment: Bipolar 1 Disorder, most recent episode depressed   Musculoskeletal: Strength & Muscle Tone: within normal limits Gait & Station: normal Patient leans: N/A  Psychiatric Specialty Exam: Physical Exam  Review of Systems  Constitutional: Negative.   HENT: Negative.   Eyes: Negative.   Respiratory: Negative.   Cardiovascular: Negative.   Gastrointestinal: Negative.   Genitourinary: Negative.   Musculoskeletal: Negative.   Skin: Negative.   Neurological: Negative.   Endo/Heme/Allergies: Negative.   Psychiatric/Behavioral: Positive for depression and substance abuse (Positive for marijuana on UDS). Negative for suicidal ideas, hallucinations and memory loss. The patient is nervous/anxious and has insomnia.     Blood pressure 123/93, pulse 74, temperature 98.6 F (37 C), temperature source Oral, resp.  rate 17, height  (1.854 m), weight 75.751 kg (167 lb).Body mass index is 22.04 kg/(m^2).  General Appearance: Casual  Eye Contact::  Fair  Speech:  Clear and Coherent  Volume:  Normal  Mood:  Angry  Affect:  Anxious   Thought Process:  Coherent and Goal Directed  Orientation:  Full (Time, Place, and Person)  Thought Content:  Rumination  Suicidal Thoughts:  No  Homicidal Thoughts:  No  Memory:  Immediate;   Good Recent;    Good Remote;   Good  Judgement:  Impaired  Insight:  Lacking  Psychomotor Activity:  Increased and Restlessness  Concentration:  Fair  Recall:  Good  Fund of Knowledge:Good  Language: Good  Akathisia:  No  Handed:  Right  AIMS (if indicated):     Assets:  Communication Skills Desire for Improvement Housing Intimacy Leisure Time Physical Health Resilience Social Support Talents/Skills  ADL's:  Intact  Cognition: WNL  Sleep:        Current Medications: Current Facility-Administered Medications  Medication Dose Route Frequency Provider Last Rate Last Dose  . acetaminophen (TYLENOL) tablet 650 mg  650 mg Oral Q6H PRN Thermon Leyland, NP      . alum & mag hydroxide-simeth (MAALOX/MYLANTA) 200-200-20 MG/5ML suspension 30 mL  30 mL Oral Q4H PRN Thermon Leyland, NP      . feeding supplement (ENSURE ENLIVE) (ENSURE ENLIVE) liquid 237 mL  237 mL Oral BID BM Sanjuana Kava, NP   237 mL at 04/13/15 1717  . hydrOXYzine (ATARAX/VISTARIL) tablet 50 mg  50 mg Oral Q6H PRN Thermon Leyland, NP   50 mg at 04/15/15 1315  . lamoTRIgine (LAMICTAL) tablet 25 mg  25 mg Oral Daily Thermon Leyland, NP   25 mg at 04/15/15 1042  . magnesium hydroxide (MILK OF MAGNESIA) suspension 30 mL  30 mL Oral Daily PRN Thermon Leyland, NP      . nicotine (NICODERM CQ - dosed in mg/24 hours) patch 21 mg  21 mg Transdermal Q24H Rachael Fee, MD   21 mg at 04/14/15 0800  . temazepam (RESTORIL) capsule 15 mg  15 mg Oral QHS PRN Worthy Flank, NP   15 mg at 04/14/15 2100  . traZODone (DESYREL) tablet 100 mg  100 mg Oral QHS Thermon Leyland, NP        Lab Results: No results found for this or any previous visit (from the past 48 hour(s)).  Physical Findings: AIMS: Facial and Oral Movements Muscles of Facial Expression: None, normal Lips and Perioral Area: None, normal Jaw: None, normal Tongue: None, normal,Extremity Movements Upper (arms, wrists, hands, fingers): None, normal Lower (legs, knees, ankles, toes): None,  normal, Trunk Movements Neck, shoulders, hips: None, normal, Overall Severity Severity of abnormal movements (highest score from questions above): None, normal Incapacitation due to abnormal movements: None, normal Patient's awareness of abnormal movements (rate only patient's report): No Awareness, Dental Status Current problems with teeth and/or dentures?: No Does patient usually wear dentures?: No  CIWA:    COWS:     Treatment Plan Summary: Daily contact with patient to assess and evaluate symptoms and progress in treatment and Medication management  Continue crisis management and stabilization.  Medication management:  -Continue Lamictal 25 mg daily for mood control -Increase Vistaril to 50 mg every six hour prn symptoms of anxiety Encouraged patient to attend groups and participate in group counseling sessions and activities.  Discharge plan in progress.  Continue current treatment  plan.  Address health issues: Vitals reviewed and stable.   Medical Decision Making:  Established Problem, Stable/Improving (1), Review of Psycho-Social Stressors (1), Review or order clinical lab tests (1), Review of Medication Regimen & Side Effects (2) and Review of New Medication or Change in Dosage (2)  DAVIS, LAURA NP-C 04/15/2015, 2:37 PM  I reviewed chart and agreed with the findings and treatment Plan.  Kathryne Sharper, MD

## 2015-04-15 NOTE — Plan of Care (Signed)
Problem: Ineffective individual coping Goal: STG: Patient will remain free from self harm Outcome: Progressing Patient remains free from self harm and denies SI      

## 2015-04-15 NOTE — [facility] Group Notes (Signed)
[facility] Group Notes:  (Clinical Social Work)  04/15/2015  1:15-2:15PM  Summary of Progress/Problems:   The main focus of today's process group was to   1)  discuss the importance of adding supports  2)  define health supports versus unhealthy supports  3)  identify the patient's current unhealthy supports and plan how to handle them  4)  Identify the patient's current healthy supports and plan what to add.  An emphasis was placed on using counselor, doctor, therapy groups, 12-step groups, and problem-specific support groups to expand supports.    The patient expressed full comprehension of the concepts presented, and agreed that there is a need to add more supports.  The patient engaged fully in the hour+ discussion, offering his ideas about positive past-times to other patients.  He talked about using his motorcycle as a means of feeling good, or getting rid of anger.  He said he goes on rides and invited another bike rider to accompany that group, saying it is 50-100 people every week.  He also admitted that there are times when he gets very angry that he will ride his motorbike and find himself going .   Type of Therapy:  Process Group with Motivational Interviewing  Participation Level:  Active  Participation Quality:  Attentive, Sharing and Supportive  Affect:  Blunted  Cognitive:  Alert and Appropriate  Insight:  Improving  Engagement in Therapy:  Engaged  Modes of Intervention:   Education, Support and Processing, Activity  Ambrose Mantle, LCSW 04/15/2015

## 2015-04-15 NOTE — [facility] Group Notes (Signed)
[facility] Group Notes:  (Nursing/MHT/Case Management/Adjunct)  Date:  04/15/2015  Time:  12:51 PM  Type of Therapy:  Nurse Education  Participation Level:  Minimal  Participation Quality:  Appropriate and Attentive  Affect:  Depressed  Cognitive:  Appropriate  Insight:  Lacking  Engagement in Group:  Engaged  Modes of Intervention:  Discussion and Education  Summary of Progress/Problems: The purpose of this group is to discuss the topic of the day which is Pharmacologist. Steps to making a change was also discussed. Patient came to group and was engaged but declined to speak.   Marzetta Board E 04/15/2015, 12:51 PM

## 2015-04-15 NOTE — Progress Notes (Signed)
Patient ID: Martin Stephens, male   DOB: 1991/04/25, 24 y.o.   MRN: [id number]  DAR: Pt. Denies SI/HI and A/V Hallucinations. He reports that he slept fair, appetite is good, energy is normal, and concentration is good. He rates her depression 0/10, hopelessness 0/10, and anxiety 5/10. Support and encouragement provided to the patient. Scheduled medications administered to patient per physician's orders. Patient received PRN Vistaril for anxiety. Patient is receptive and cooperative however in the morning was slightly irritable. Patient became more approachable and open as the day went on. Patient reported that he did not want to take any calls or have any visitors at this time. He reports he is ready to leave but states, "I showed my ass the first day so I don't know if that will effect it." Q15 minute checks are maintained for safety.

## 2015-04-16 DIAGNOSIS — F3130 Bipolar disorder, current episode depressed, mild or moderate severity, unspecified: Principal | ICD-10-CM

## 2015-04-16 MED ORDER — NICOTINE 21 MG/24HR TD PT24: 21.0000 mg | MEDICATED_PATCH | TRANSDERMAL | Status: DC

## 2015-04-16 MED ORDER — TRAZODONE HCL 100 MG PO TABS: 100.0000 mg | ORAL_TABLET | Freq: Every day | ORAL | Status: DC

## 2015-04-16 MED ORDER — OLANZAPINE 5 MG PO TBDP
5.0000 mg | ORAL_TABLET | Freq: Three times a day (TID) | ORAL | Status: DC | PRN
  Administered 2015-04-16: 5 mg via ORAL
  Filled 2015-04-16: qty 1

## 2015-04-16 MED ORDER — LAMOTRIGINE 25 MG PO TABS: 50.0000 mg | ORAL_TABLET | Freq: Two times a day (BID) | ORAL | Status: DC

## 2015-04-16 NOTE — Progress Notes (Signed)
Patient ID: Martin Stephens, male   DOB: 27-Nov-1990, 24 y.o.   MRN: [id number] D: Patient is alert and cooperative.  Refused his morning medications. Denies SI/HI/AVH.  Appetite and concentration level is good.  Patient states medication is not working for him.  Patient states, " slept fair last night."  Patient rates anxiety level at 8. Patient report goal for today is to go home.   Maintained on 15 minutes checks for safety per protocol. A:  Received Vistaril for anxiety with no effect.  Support and encouragement offered. R: Patient seen pacing the hall after administration.  Patient came back to the medication window to ask for extra medication for anxiety.  When writer explained to patient about the administration time.  Patient got upset and used profane language.  Patient then stormed away from the window.

## 2015-04-16 NOTE — Progress Notes (Signed)
D: Patient alert and oriented x 4. Patient denies pain, SI/HI/AVH. Patient states, "My anxiety is really high, I think that medication I took earlier increased my anxiety, instead of lower it." Patient reported to this writer he did not sleep well last night and only got 3 hours of sleep. PRN Restoril give at 2140. Patient requested Vistaril but once opened patient refused. At 0004 patient was still awake in bed. Patient complained he was not sleepy. This writer advised patient to try to count sheep or think of something that would settle his mind.  A: Staff to monitor Q 15 mins for safety. Encouragement and support offered. Scheduled medications administered per orders. R: Patient remains safe on the unit. Patient attended group tonight. Patient visible on hte unit and interacting with peers. Patient taking administered medications.

## 2015-04-16 NOTE — [facility] Suicide Risk Assessment (Signed)
[facility] Discharge Suicide Risk Assessment   Demographic Factors:  Male, Adolescent or young adult and Caucasian  Total Time spent with patient: 30 minutes  Musculoskeletal: Strength & Muscle Tone: within normal limits Gait & Station: normal Patient leans: normal  Psychiatric Specialty Exam: Physical Exam  Review of Systems  Constitutional: Negative.   HENT: Positive for hearing loss.        Migraines occasionally  Eyes: Negative.   Respiratory:       2 packs a day  Cardiovascular: Negative.   Gastrointestinal: Negative.   Genitourinary: Negative.   Musculoskeletal: Positive for back pain.  Skin: Negative.   Neurological: Negative.   Endo/Heme/Allergies: Negative.   Psychiatric/Behavioral: Negative.     Blood pressure 104/53, pulse 56, temperature 97.7 F (36.5 C), temperature source Oral, resp. rate 16, height 6\' 1"  (1.854 m), weight 75.751 kg (167 lb).Body mass index is 22.04 kg/(m^2).  General Appearance: Fairly Groomed  Patent attorney::  Fair  Speech:  Clear and Coherent409  Volume:  Normal  Mood:  Euthymic  Affect:  Appropriate  Thought Process:  Coherent and Goal Directed  Orientation:  Full (Time, Place, and Person)  Thought Content:  plans as he moves on  Suicidal Thoughts:  No  Homicidal Thoughts:  No  Memory:  Immediate;   Fair Recent;   Fair Remote;   Fair  Judgement:  Fair  Insight:  Present  Psychomotor Activity:  Normal  Concentration:  Fair  Recall:  [employer] of Knowledge:Fair  Language: Fair  Akathisia:  No  Handed:  Right  AIMS (if indicated):     Assets:  Communication Skills Desire for Improvement Housing Physical Health Resilience Social Support Transportation Vocational/Educational  Sleep:     Cognition: WNL  ADL's:  Intact   Have you used any form of tobacco in the last 30 days? (Cigarettes, Smokeless Tobacco, Cigars, and/or Pipes): Yes  Has this patient used any form of tobacco in the last 30 days? (Cigarettes, Smokeless Tobacco,  Cigars, and/or Pipes) Yes, A prescription for an FDA-approved tobacco cessation medication was offered at discharge and the patient refused  Mental Status Per Nursing Assessment::   On Admission:  Suicidal ideation indicated by patient  Current Mental Status by Physician: In full contact with reality. There are no active SI plans or intent. He states he has realized he needs to work on self, on his anger. He states he had already changed his mind as far as wanting to hurt himself by the time he came here 3 days ago. He states he will not get himself in that situation again. He is planning to pursue outpatient treatment   Loss Factors: NA  Historical Factors: NA  Risk Reduction Factors:   Responsible for children under 26 years of age, Sense of responsibility to family, Employed and Positive social support  Continued Clinical Symptoms:  Depression:   Impulsivity  Cognitive Features That Contribute To Risk:  None    Suicide Risk:  Minimal: No identifiable suicidal ideation.  Patients presenting with no risk factors but with morbid ruminations; may be classified as minimal risk based on the severity of the depressive symptoms  Principal Problem: Bipolar I disorder, most recent episode depressed Discharge Diagnoses:  Patient Active Problem List   Diagnosis Date Noted  . Bipolar I disorder, most recent episode depressed [F31.30] 04/14/2015  . MDD (major depressive disorder), recurrent episode, severe [F33.2] 04/13/2015    Follow-up Information    Follow up with [facility].  Why:  Please call to schedule medication management appointment with PCP Prudy Feeler.   Contact information:   [address] [place], [place] [address] [phone]      Plan Of Care/Follow-up recommendations:  Activity:  as tolerated Diet:  regular Follow up as above. Will call with appointments for follow up Is patient on multiple antipsychotic therapies at discharge:  No   Has Patient had three  or more failed trials of antipsychotic monotherapy by history:  No  Recommended Plan for Multiple Antipsychotic Therapies: NA    Blade Scheff A 04/16/2015, 4:41 PM

## 2015-04-16 NOTE — Progress Notes (Signed)
Recreation Therapy Notes  Date: 08.01.16 Time: 9:30 am Location: [address] Group Room  Group Topic: Stress Management  Goal Area(s) Addresses:  Patient will verbalize importance of using healthy stress management.  Patient will identify positive emotions associated with healthy stress management.   Intervention: Stress Management  Activity :  Guided Training and development officer.  LRT introduced and educated patients on the stress management technique of guided imagery script.  A script was read and patients were asked to follow a long as the LRT read the script a loud to engaged in the technique of guided imagery.  Education:  Stress Management, Discharge Planning.   Education Outcome: Acknowledges edcuation/In group clarification offered/Needs additional education  Clinical Observations/Feedback: Patient did not attend group.   Caroll Rancher, LRT/CTRS         Lillia Abed, Georgios Kina A 04/16/2015 1:19 PM

## 2015-04-16 NOTE — Discharge Summary (Signed)
Physician Discharge Summary Note  Patient:  Martin Stephens is an 24 y.o., male MRN:  [id number] DOB:  Nov 24, 1990 Patient phone:  [phone] (home)  Patient address:   [address] [place] [address],  Total Time spent with patient: 45 minutes  Date of Admission:  04/13/2015 Date of Discharge: 04/16/15  Reason for Admission:   Martin Stephens is a 24 year old male who presented to [facility] for evaluation of depression and suicidal thoughts yesterday after considering [place]. He reported a history of anger control issues dating back to high school. He stated he's been more labile with just the last few months. Has been more depressed. Stated dhe is normally considerable stress about work, money, and his relationship. He is a long-term relationship with his daughters mother. He works as a Games developer. He states he wanted to try to take yesterday to "get away". He went to [employer] and walked. He got up to the ledge per his report. States he did not drive to the intent of hurting himself. However, while there he considered jumping in thinking "would end it all it might not hurt". He has never been evaluated or treated for mental health disorders including depression or anxiety. Since arriving to the adult 400 unit the patient has been requesting discharge. Patient is insistent that being "here is just making me worse. I need to be with my son. I made a promise to take him swimming. I can got to counseling and achieve what I need. I want someone to talk with. I normally self medicate with marijuana but recently I ran out of money.   It's the only thing that makes me feel normal. No I was not going to jump. I just went there to relax. I did not go up there to the rock. I'm so upset that I can't see my son while I am here. I am more agitated than depressed. I mean I came here voluntarily and did not expect to come here. Yes you can talk  to my father or my fiance. But I'm not really close to any family." During the psychiatric assessment the patient was very argumentative and spent time defending his use of marijuana. Patient is irritable, labile and endorse that he has history of mood swings anger and agitation. He reported a lot of psychosocial stressors including dealing with lawyer for custody battle for her girlfriend and financial stress. Patient endorsed father has bipolar disorder.Per patient's permission his girlfriend Nehemiah Settle was contacted for collateral information. She expressed concern that the patient had strong intention to commit suicide. She reported that Martin sent a text that indicated he had no reason to live and that she responded by sending a picture of her and son. She reported extreme periods of mood lability and problems with anger. The girlfriend reported that the patient actually left work to go to [employer] for the purpose of suicide attempt. Martin Stephens has signed his 72 hour request for discharge. He was informed that the medication Lamictal would be started and that he would need to be monitored for at least 72 hours. He has no insight into taking prescribed medication but rather endorses his marijuana use. Patient's UDS is positive for marijuana and benzo. He reports his mother gave him a xanax before to relax.   Principal Problem: Bipolar I disorder, most recent episode depressed Discharge Diagnoses: Patient Active Problem List   Diagnosis Date Noted  .  Bipolar I disorder, most recent episode depressed [F31.30] 04/14/2015    Priority: High  . MDD (major depressive disorder), recurrent episode, severe [F33.2] 04/13/2015    Priority: High    Musculoskeletal: Strength & Muscle Tone: within normal limits Gait & Station: normal Patient leans: N/A  Psychiatric Specialty Exam: Physical Exam  Review of Systems  Psychiatric/Behavioral: Positive for depression and substance abuse. Negative for suicidal  ideas and hallucinations. The patient is nervous/anxious and has insomnia.   All other systems reviewed and are negative.   Blood pressure 104/53, pulse 56, temperature 97.7 F (36.5 C), temperature source Oral, resp. rate 16, height 6\' 1"  (1.854 m), weight 75.751 kg (167 lb).Body mass index is 22.04 kg/(m^2).    SEE MD PSE within the SRA Have you used any form of tobacco in the last 30 days? (Cigarettes, Smokeless Tobacco, Cigars, and/or Pipes): Yes  Has this patient used any form of tobacco in the last 30 days? (Cigarettes, Smokeless Tobacco, Cigars, and/or Pipes) Yes, A prescription for an FDA-approved tobacco cessation medication was offered at discharge and the patient accepted.  Past Medical History:  Past Medical History  Diagnosis Date  . Depression   . Anxiety    History reviewed. No pertinent past surgical history. Family History: History reviewed. No pertinent family history. Social History:  History  Alcohol Use No     History  Drug Use  . Yes  . Special: Marijuana    History   Social History  . Marital Status: Single    Spouse Name: N/A  . Number of Children: N/A  . Years of Education: N/A   Social History Main Topics  . Smoking status: Current Every Day Smoker  . Smokeless tobacco: Not on file  . Alcohol Use: No  . Drug Use: Yes    Special: Marijuana  . Sexual Activity: Not on file   Other Topics Concern  . None   Social History Narrative     Risk to Self: Is patient at risk for suicide?: Yes What has been your use of drugs/alcohol within the last 12 months?: Patient smokes 5 grams per day. Patient stated he smokes when he starts to feel nervous. Risk to Others:   Prior Inpatient Therapy:   Prior Outpatient Therapy:    Level of Care:  OP  Hospital Course:   Martin Stephens was admitted for Bipolar I disorder, most recent episode depressed, and crisis management.  Pt was treated discharged with the medications listed below under Medication  List.  Medical problems were identified and treated as needed.  Home medications were restarted as appropriate.  Improvement was monitored by observation and Martin Stephens 's daily report of symptom reduction.  Emotional and mental status was monitored by daily self-inventory reports completed by Martin Stephens and clinical staff.         Martin Stephens was evaluated by the treatment team for stability and plans for continued recovery upon discharge. Martin Stephens 's motivation was an integral factor for scheduling further treatment. Employment, transportation, bed availability, health status, family support, and any pending legal issues were also considered during hospital stay. Pt was offered further treatment options upon discharge including but not limited to Residential, Intensive Outpatient, and Outpatient treatment.  Martin Stephens will follow up with the services as listed below under Follow Up Information.    Upon completion of this admission the patient was both mentally and medically stable for discharge denying suicidal/homicidal ideation, auditory/visual/tactile hallucinations, delusional  thoughts and paranoia.    Consults:  None  Significant Diagnostic Studies:  UDS + for benzo and THC  Discharge Vitals:   Blood pressure 104/53, pulse 56, temperature 97.7 F (36.5 C), temperature source Oral, resp. rate 16, height 6\' 1"  (1.854 m), weight 75.751 kg (167 lb). Body mass index is 22.04 kg/(m^2). Lab Results:   No results found for this or any previous visit (from the past 72 hour(s)).  Physical Findings: AIMS: Facial and Oral Movements Muscles of Facial Expression: None, normal Lips and Perioral Area: None, normal Jaw: None, normal Tongue: None, normal,Extremity Movements Upper (arms, wrists, hands, fingers): None, normal Lower (legs, knees, ankles, toes): None, normal, Trunk Movements Neck, shoulders, hips: None, normal, Overall Severity Severity of  abnormal movements (highest score from questions above): None, normal Incapacitation due to abnormal movements: None, normal Patient's awareness of abnormal movements (rate only patient's report): No Awareness, Dental Status Current problems with teeth and/or dentures?: No Does patient usually wear dentures?: No  CIWA:    COWS:      See Psychiatric Specialty Exam and Suicide Risk Assessment completed by Attending Physician prior to discharge.  Discharge destination:  Home  Is patient on multiple antipsychotic therapies at discharge:  No   Has Patient had three or more failed trials of antipsychotic monotherapy by history:  No    Recommended Plan for Multiple Antipsychotic Therapies: NA     Medication List    STOP taking these medications        vitamin A & D ointment      TAKE these medications      Indication   lamoTRIgine 25 MG tablet  Commonly known as:  LAMICTAL  Take 2 tablets (50 mg total) by mouth 2 (two) times daily.   Indication:  mood stabilization     nicotine 21 mg/24hr patch  Commonly known as:  NICODERM CQ - dosed in mg/24 hours  Place 1 patch (21 mg total) onto the skin daily.   Indication:  Nicotine Addiction     traZODone 100 MG tablet  Commonly known as:  DESYREL  Take 1 tablet (100 mg total) by mouth at bedtime.   Indication:  Trouble Sleeping           Follow-up Information    Follow up with [facility].   Why:  Please call to schedule medication management appointment with PCP Prudy Feeler.   Contact information:   [address] [place], [place] [address] [phone]      Follow-up recommendations:  Activity:  As tolerated Diet:  Heart healthy with low sodium.  Comments:  Take all medications as prescribed. Keep all follow-up appointments as scheduled.  Do not consume alcohol or use illegal drugs while on prescription medications. Report any adverse effects from your medications to your primary care provider promptly.  In the  event of recurrent symptoms or worsening symptoms, call 911, a crisis hotline, or go to the nearest emergency department for evaluation.   Total Discharge Time: Greater than 30 minutes  Signed: Beau Fanny, FNP-BC 04/16/2015, 4:56 PM  I personally assessed the patient and formulated the plan Madie Reno A. Dub Mikes, M.D.

## 2015-04-16 NOTE — [facility] Group Notes (Signed)
[facility] LCSW Group Therapy  04/16/2015 1:15pm  Type of Therapy:  Group Therapy vercoming Obstacles  Participation Level:  Active  Participation Quality:  Appropriate   Affect:  Appropriate  Cognitive:  Appropriate and Oriented  Insight:  Developing/Improving and Improving  Engagement in Therapy:  Improving  Modes of Intervention:  Discussion, Exploration, Problem-solving and Support  Description of Group:   In this group patients will be encouraged to explore what they see as obstacles to their own wellness and recovery. They will be guided to discuss their thoughts, feelings, and behaviors related to these obstacles. The group will process together ways to cope with barriers, with attention given to specific choices patients can make. Each patient will be challenged to identify changes they are motivated to make in order to overcome their obstacles. This group will be process-oriented, with patients participating in exploration of their own experiences as well as giving and receiving support and challenge from other group members.  Summary of Patient Progress: Pt participated in group today. Was observed to have rigid thinking regarding the trustworthiness of others, expressing that everyone lies to get what they want except for him. However, Pt was receptive to feedback from CSW and peers regarding the validity of that statement and the benefits to "giving someone a chance." Pt expressed that his anger is his biggest obstacle as it is physically destructive as well as emotionally destructive in his relationships. Pt has limited insight into his triggers and warning signs related to his anger but is willing to process this with peers.   Therapeutic Modalities:   Cognitive Behavioral Therapy Solution Focused Therapy Motivational Interviewing Relapse Prevention Therapy   Chad Cordial, LCSWA 04/16/2015 4:16 PM

## 2015-04-16 NOTE — [facility] Group Notes (Signed)
[facility] LCSW Aftercare Discharge Planning Group Note  04/16/2015  8:45 AM  Participation Quality: Did Not Attend. Patient invited to participate but declined.  Stellar Gensel, MSW, LCSWA Clinical Social Worker [facility] [phone]   

## 2015-04-16 NOTE — Progress Notes (Signed)
Patient ID: Martin Stephens, male   DOB: October 16, 1990, 24 y.o.   MRN: [id number] Discharge Note: Patient is alert and cooperative.  Patient denies SI/HI and auditory/Visual Hallucination.  Contracted for safety. Verbalizes readiness for discharge.   Maintained on routine 15 minutes checks for safety per protocol until discharge.  Discussed discharge medications and outpatient appointments.  Personal belongings returned to patient.  Patient transported by security to [facility] ED to get his car.

## 2015-04-16 NOTE — Plan of Care (Signed)
Problem: Ineffective individual coping Goal: STG-Increase in ability to manage activities of daily living Outcome: Progressing Patient able to manage ADL's without difficulty.

## 2015-04-16 NOTE — [facility] Group Notes (Signed)
[facility] LCSW Group Therapy 04/16/2015  1:15 PM   Type of Therapy: Group Therapy  Participation Level: Did Not Attend. Patient invited to participate but declined.   Daiden Coltrane, MSW, LCSWA Clinical Social Worker [facility] [phone]    

## 2015-04-17 NOTE — [facility] Suicide Risk Assessment (Signed)
[facility] INPATIENT:  Family/Significant Other Suicide Prevention Education  Suicide Prevention Education:  Patient Refusal for Family/Significant Other Suicide Prevention Education: The patient Martin Stephens has refused to provide written consent for family/significant other to be provided Family/Significant Other Suicide Prevention Education during admission and/or prior to discharge.  Physician notified.  Brieanne Mignone, [place] 04/17/2015, 8:31 AM

## 2015-04-17 NOTE — Clinical Social Work Note (Signed)
CSW attempted to contact patient to inform him of upcoming scheduled therapy appointment but was unable to leave a voicemail. CSW left message with patient's mother Dorsel Flinn [phone] for patient to call CSW back in order to get appt information.  Samuella Bruin, MSW, [employer] Clinical Social Worker [facility] [phone]

## 2015-04-17 NOTE — Progress Notes (Signed)
  [facility] Adult Case Management Discharge Plan :  Will you be returning to the same living situation after discharge:  Yes,  Patient returned home At discharge, do you have transportation home?: Yes,  patient arranged transportation home Do you have the ability to pay for your medications: Yes,  patient provided with prescriptions  Release of information consent forms completed and in the chart;  Patient's signature needed at discharge.  Patient to Follow up at: Follow-up Information    Follow up with [facility].   Why:  Please call to schedule medication management appointment with PCP Prudy Feeler.   Contact information:   [address] [place], [place] [address] [phone]      Follow up with [facility] On 04/24/2015.   Why:  Therapy appointment with Reather Laurence on Tuesday August 9th at 11 am. Please bring your insurance card and arrive at 10:40am to complete paperwork. Call if you need to reschedule.   Contact information:   [address] [place], [place], [address] [phone]  Office      Patient denies SI/HI: Yes,  denies    Aeronautical engineer and Suicide Prevention discussed: Yes,  with patient   Have you used any form of tobacco in the last 30 days? (Cigarettes, Smokeless Tobacco, Cigars, and/or Pipes): Yes  Has patient been referred to the Quitline?: Patient refused referral  Glenroy Crossen, [place] 04/17/2015, 10:48 AM

## 2016-05-28 ENCOUNTER — Ambulatory Visit: Payer: Self-pay | Admitting: Physician Assistant

## 2016-05-29 ENCOUNTER — Encounter: Payer: Self-pay | Admitting: Physician Assistant

## 2017-01-30 ENCOUNTER — Encounter: Payer: Self-pay | Admitting: Physician Assistant

## 2017-01-30 ENCOUNTER — Ambulatory Visit (INDEPENDENT_AMBULATORY_CARE_PROVIDER_SITE_OTHER): Payer: 59 | Admitting: Physician Assistant

## 2017-01-30 VITALS — BP 115/76 | HR 58 | Temp 97.8°F | Ht 73.0 in | Wt 179.6 lb

## 2017-01-30 DIAGNOSIS — K047 Periapical abscess without sinus: Secondary | ICD-10-CM | POA: Diagnosis not present

## 2017-01-30 MED ORDER — HYDROCODONE-ACETAMINOPHEN 10-325 MG PO TABS: 1.0000 | ORAL_TABLET | Freq: Three times a day (TID) | ORAL | 0 refills | Status: DC | PRN

## 2017-01-30 NOTE — Progress Notes (Signed)
BP 115/76   Pulse (!) 58   Temp 97.8 F (36.6 C) (Oral)   Ht 6\' 1"  (1.854 m)   Wt 179 lb 9.6 oz (81.5 kg)   BMI 23.70 kg/m    Subjective:    Patient ID: Martin Stephens Mccurdy, male    DOB: 08/19/1991, 26 y.o.   MRN: [id number]  HPI: Martin Stephens Foushee is a 26 y.o. male presenting on 01/30/2017 for Dental Pain (dentist won't pull till next week. ibuprofen 800 mg tearing stomach up, is on PCN)  Patient saw a second dentist in his office this morning. There was significant infection found around the tooth on the right lower area. He has a plan for next Friday to have it extracted by an oral surgeon. They did send him home with penicillin and ibuprofen. The patient however states he has had a tremendous amount of ibuprofen and his stomach is hurting a lot. He states that he's been taking it for about a week now. He is not sure he can continue with more of the ibuprofen.  Relevant past medical, surgical, family and social history reviewed and updated as indicated. Allergies and medications reviewed and updated.  Past Medical History:  Diagnosis Date  . Anxiety   . Depression     History reviewed. No pertinent surgical history.  Review of Systems  Constitutional: Negative.  Negative for appetite change and fatigue.  HENT: Negative.   Eyes: Negative.  Negative for pain and visual disturbance.  Respiratory: Negative.  Negative for cough, chest tightness, shortness of breath and wheezing.   Cardiovascular: Negative.  Negative for chest pain, palpitations and leg swelling.  Gastrointestinal: Negative.  Negative for abdominal pain, diarrhea, nausea and vomiting.  Endocrine: Negative.   Genitourinary: Negative.   Musculoskeletal: Negative.   Skin: Negative.  Negative for color change and rash.  Neurological: Negative.  Negative for weakness, numbness and headaches.  Psychiatric/Behavioral: Negative.     Allergies as of 01/30/2017   No Known Allergies     Medication List         Accurate as of 01/30/17  2:09 PM. Always use your most recent med list.          HYDROcodone-acetaminophen 10-325 MG tablet Commonly known as:  NORCO Take 1 tablet by mouth every 8 (eight) hours as needed.   ibuprofen 600 MG tablet Commonly known as:  ADVIL,MOTRIN Take 600 mg by mouth every 6 (six) hours as needed.   penicillin v potassium 500 MG tablet Commonly known as:  VEETID Take 500 mg by mouth 4 (four) times daily.          Objective:    BP 115/76   Pulse (!) 58   Temp 97.8 F (36.6 C) (Oral)   Ht 6\' 1"  (1.854 m)   Wt 179 lb 9.6 oz (81.5 kg)   BMI 23.70 kg/m   No Known Allergies  Physical Exam  Constitutional: He appears well-developed and well-nourished. No distress.  HENT:  Head: Normocephalic and atraumatic.  Mouth/Throat: Dental abscesses and dental caries present.  Eyes: Conjunctivae and EOM are normal. Pupils are equal, round, and reactive to light.  Cardiovascular: Normal rate, regular rhythm and normal heart sounds.   Pulmonary/Chest: Effort normal and breath sounds normal. No respiratory distress.  Skin: Skin is warm and dry.  Psychiatric: He has a normal mood and affect. His behavior is normal.  Nursing note and vitals reviewed.       Assessment & Plan:   1. Dental  abscess - penicillin v potassium (VEETID) 500 MG tablet; Take 500 mg by mouth 4 (four) times daily. - ibuprofen (ADVIL,MOTRIN) 600 MG tablet; Take 600 mg by mouth every 6 (six) hours as needed. - HYDROcodone-acetaminophen (NORCO) 10-325 MG tablet; Take 1 tablet by mouth every 8 (eight) hours as needed.  Dispense: 40 tablet; Refill: 0   Continue all other maintenance medications as listed above.  Follow up plan: Return if symptoms worsen or fail to improve.  Educational handout given for dental abscess  Remus Loffler PA-C [facility] [address]  [place], [place] [address] [phone]   01/30/2017, 2:09 PM

## 2017-01-30 NOTE — Patient Instructions (Signed)
Dental Abscess A dental abscess is pus in or around a tooth. Follow these instructions at home:  Take medicines only as told by your dentist.  If you were prescribed antibiotic medicine, finish all of it even if you start to feel better.  Rinse your mouth (gargle) often with salt water.  Do not drive or use heavy machinery, like a lawn mower, while taking pain medicine.  Do not apply heat to the outside of your mouth.  Keep all follow-up visits as told by your dentist. This is important. Contact a doctor if:  Your pain is worse, and medicine does not help. Get help right away if:  You have a fever or chills.  Your symptoms suddenly get worse.  You have a very bad headache.  You have problems breathing or swallowing.  You have trouble opening your mouth.  You have puffiness (swelling) in your neck or around your eye. This information is not intended to replace advice given to you by your health care provider. Make sure you discuss any questions you have with your health care provider. Document Released: 01/16/2015 Document Revised: 02/07/2016 Document Reviewed: 08/29/2014 Elsevier Interactive Patient Education  2017 Elsevier Inc.  

## 2018-03-01 ENCOUNTER — Encounter: Payer: Self-pay | Admitting: Family Medicine

## 2018-03-01 ENCOUNTER — Ambulatory Visit: Payer: 59 | Admitting: Family Medicine

## 2018-03-01 VITALS — BP 119/87 | HR 77 | Temp 99.0°F | Ht 73.0 in | Wt 194.0 lb

## 2018-03-01 DIAGNOSIS — L03116 Cellulitis of left lower limb: Secondary | ICD-10-CM | POA: Diagnosis not present

## 2018-03-01 DIAGNOSIS — L02416 Cutaneous abscess of left lower limb: Secondary | ICD-10-CM | POA: Diagnosis not present

## 2018-03-01 MED ORDER — SULFAMETHOXAZOLE-TRIMETHOPRIM 800-160 MG PO TABS: 1.0000 | ORAL_TABLET | Freq: Two times a day (BID) | ORAL | 0 refills | Status: DC

## 2018-03-01 NOTE — Progress Notes (Signed)
   HPI  Patient presents today with concern for abscess.  Patient states symptoms started about 5 days ago, he was able to get a small amount of yellow fluid out of the leg with aggressive palpation this morning in the shower. He states he started penicillin last night that was left over from an old dental infection which caused him to have nausea and vomiting.  He denies fever, chills, sweats. He is tolerating food and fluids like usual now.   PMH: Smoking status noted ROS: Per HPI  Objective: BP 119/87 (BP Location: Left Arm, Patient Position: Sitting, Cuff Size: Normal)   Pulse 77   Temp 99 F (37.2 C) (Oral)   Ht 6\' 1"  (1.854 m)   Wt 194 lb (88 kg)   BMI 25.60 kg/m  Gen: NAD, alert, cooperative with exam HEENT: NCAT CV: RRR, good S1/S2, no murmur Resp: CTABL, no wheezes, non-labored Ext: No edema, warm Neuro: Alert and oriented, No gross deficits  Skin:  Left anterior thigh with 3 cm in diameter circular area of erythema with surrounding area of erythema measuring 6 cm in diameter Small area of fluctuance centrally, 6 cm area of induration   Incision and drainage Patient declined analgesia and cold spray to help with incision Using an 11 blade a very small, 5 to 7 mm, incision was made ventrally in the abscess, with palpation thick purulent bloody fluid was expressed with good relief of pressure  Assessment and plan:  #Abscesses cellulitis Incision and drainage as above was limited due to lack of analgesia, however a good amount of purulence was removed Start Bactrim with patient not tolerating penicillin   Meds ordered this encounter  Medications  . sulfamethoxazole-trimethoprim (BACTRIM DS) 800-160 MG tablet    Sig: Take 1 tablet by mouth 2 (two) times daily.    Dispense:  14 tablet    Refill:  0    Murtis SinkSam Kaleem Sartwell, MD [facility] 03/01/2018, 5:18 PM

## 2018-03-01 NOTE — Patient Instructions (Addendum)
Great to see you!  Keep the area clean and dry.  It is okay to soak in a tub and squeeze out the purulent material, however dried off well afterwards.   It will be helpful to apply warm compress and expressed the rest of the fluid that is present in the lesion.  Be sure to finish all antibiotics, call if you cannot tolerate them.

## 2018-03-22 ENCOUNTER — Other Ambulatory Visit: Payer: Self-pay | Admitting: Physician Assistant

## 2018-03-22 ENCOUNTER — Telehealth: Payer: Self-pay | Admitting: Family Medicine

## 2018-03-22 MED ORDER — SULFAMETHOXAZOLE-TRIMETHOPRIM 800-160 MG PO TABS: 1.0000 | ORAL_TABLET | Freq: Two times a day (BID) | ORAL | 0 refills | Status: DC

## 2018-03-22 NOTE — Telephone Encounter (Signed)
I sent the medication.

## 2018-03-22 NOTE — Telephone Encounter (Signed)
Patient aware that rx has been sent. 

## 2018-06-14 ENCOUNTER — Other Ambulatory Visit: Payer: Self-pay | Admitting: Physician Assistant

## 2018-06-14 ENCOUNTER — Telehealth: Payer: Self-pay | Admitting: Physician Assistant

## 2018-06-14 MED ORDER — SULFAMETHOXAZOLE-TRIMETHOPRIM 800-160 MG PO TABS: 1.0000 | ORAL_TABLET | Freq: Two times a day (BID) | ORAL | 0 refills | Status: DC

## 2018-06-14 NOTE — Telephone Encounter (Signed)
sent 

## 2018-06-15 NOTE — Telephone Encounter (Signed)
Patient aware.

## 2019-03-24 ENCOUNTER — Other Ambulatory Visit: Payer: Self-pay

## 2019-03-24 ENCOUNTER — Ambulatory Visit (INDEPENDENT_AMBULATORY_CARE_PROVIDER_SITE_OTHER): Payer: BC Managed Care – PPO

## 2019-03-24 ENCOUNTER — Encounter: Payer: Self-pay | Admitting: Family

## 2019-03-24 ENCOUNTER — Ambulatory Visit: Payer: BC Managed Care – PPO | Admitting: Family

## 2019-03-24 VITALS — BP 113/79 | HR 74 | Temp 98.7°F | Ht 72.0 in | Wt 188.2 lb

## 2019-03-24 DIAGNOSIS — S62002A Unspecified fracture of navicular [scaphoid] bone of left wrist, initial encounter for closed fracture: Secondary | ICD-10-CM | POA: Diagnosis not present

## 2019-03-24 DIAGNOSIS — S62015A Nondisplaced fracture of distal pole of navicular [scaphoid] bone of left wrist, initial encounter for closed fracture: Secondary | ICD-10-CM | POA: Diagnosis not present

## 2019-03-24 DIAGNOSIS — S62012A Displaced fracture of distal pole of navicular [scaphoid] bone of left wrist, initial encounter for closed fracture: Secondary | ICD-10-CM | POA: Diagnosis not present

## 2019-03-24 DIAGNOSIS — M25532 Pain in left wrist: Secondary | ICD-10-CM | POA: Diagnosis not present

## 2019-03-24 DIAGNOSIS — S62102A Fracture of unspecified carpal bone, left wrist, initial encounter for closed fracture: Secondary | ICD-10-CM

## 2019-03-24 NOTE — Patient Instructions (Signed)
Scaphoid Fracture  A scaphoid fracture is a break in one of the small bones of the wrist. The scaphoid bone is located on the thumb side of the wrist. It supports the other seven bones that make up the wrist. The scaphoid bone has a poor blood supply, so it can take a long time to heal. You may need to wear a cast or splint for several months. What are the causes? This injury is usually caused by a fall onto an outstretched hand and arm. This type of injury may also occur if you are in a motor vehicle accident and you brace yourself with your hand. What increases the risk? You are more likely to develop this condition if you:  Play contact sports.  Participate in activities such as skiing, skating, or rollerblading. What are the signs or symptoms? Symptoms of this condition include:  Pain, especially when grasping or pinching with your thumb.  Pain when pressing on the base of your thumb, especially in the hollow area at the base of your thumb when your thumb is extended outward.  Swelling.  Bruising. How is this diagnosed? This condition may be diagnosed based on:  Your history of injury.  A physical exam of your wrist and thumb.  X-rays.  A CT scan or an MRI. A scaphoid fracture may be hard to diagnose. This is because pain may not start for a few days, the fracture does not cause a deformity, and the fracture may not limit movement. How is this treated? Treatment depends on the location of the fracture and whether the bone is out of place (displaced). Treatment may be surgical or nonsurgical. It may include:  Wearing a cast or splint from the middle of your forearm down to your wrist. Your thumb may be extended out and included in the cast or splint.  Receiving treatment with sound waves or electrical energy that stimulates healing (bone stimulator).  Having surgery. A displaced fracture may require surgery to put the pieces of bone back in the proper position. Screws or  wires may be used to hold the bone in place and a cast or splint might be applied. You may need to do exercises to help you regain wrist movement (physical therapy) after your cast or splint is removed. Follow these instructions at home: If you have a cast:  Do not put pressure on any part of the cast until it is fully hardened. This may take several hours.  Do not stick anything inside the cast to scratch your skin. Doing that increases your risk of infection.  You may put lotion on dry skin around the edges of the cast. Do not put lotion on the skin underneath the cast. If you have a splint:  Wear the splint as told by your health care provider. Remove it only as told by your health care provider.  Loosen the splint if your fingers tingle, become numb, or turn cold and blue. If you have a cast or a splint:  If the splint or cast is not waterproof: ? Do not let it get wet. ? Cover it with a watertight covering when you take a bath or shower.  Check the skin around it every day. Tell your health care provider about any concerns.  Keep it clean and dry. Managing pain, stiffness, and swelling   If directed, put ice on the injured area. ? Put ice in a plastic bag. ? Place a towel between your skin and the bag. ?  Leave the ice on for 20 minutes, 2-3 times a day.  Move your fingers often to reduce stiffness and swelling.  Raise (elevate) the injured area above the level of your heart while you are sitting or lying down. Medicines  Ask your health care provider if the medicine prescribed to you: ? Requires you to avoid driving or using heavy machinery. ? Can cause constipation. You may need to take these actions to prevent or treat constipation, such as:  Drink enough fluid to keep your urine pale yellow.  Take over-the-counter or prescription medicines.  Eat foods that are high in fiber, such as beans, whole grains, and fresh fruits and vegetables.  Limit foods that are high  in fat and processed sugars, such as fried or sweet foods. Activity  Ask your health care provider when it is safe to drive if you have a cast or splint on your hand.  Return to your normal activities as told by your health care provider. Ask your health care provider what activities are safe for you. You may need to limit activities such as contact sports, throwing, pushing, and climbing.  Do not lift anything that is heavier than 1 lb (0.5 kg), or the limit that you are told, with the affected hand until your health care provider says that it is safe.  Do physical therapy as told by your health care provider. General instructions  Take over-the-counter and prescription medicines only as told by your health care provider.  Do not use any products that contain nicotine or tobacco, such as cigarettes, e-cigarettes, and chewing tobacco. These can delay bone healing. If you need help quitting, ask your health care provider.  Keep all follow-up visits as told by your health care provider. This is important. Contact a health care provider if:  Your pain or swelling gets worse.  You have pain, numbness, or coldness in your hand or fingers.  Your cast or splint becomes loose or damaged. Get help right away if:  You lose feeling in your hand or fingers.  Your fingers or fingernails turn pale or blue. Summary  A scaphoid fracture is a break in one of the small bones of the wrist.  This injury is usually caused by a fall onto an outstretched hand and arm.  Symptoms of this injury are pain and swelling of the hand.  Treatment depends on the location of the fracture and whether the bone is out of place (displaced). Treatment may be surgical or nonsurgical.  You may need a cast or splint from the middle of your forearm down to your wrist. You may need to wear this for several months. This information is not intended to replace advice given to you by your health care provider. Make sure you  discuss any questions you have with your health care provider. Document Released: 08/22/2002 Document Revised: 09/10/2018 Document Reviewed: 09/10/2018 [employer] Patient Education  2020 [employer]

## 2019-03-24 NOTE — Progress Notes (Signed)
Subjective:    Patient ID: Martin Stephens, male    DOB: [birth year], 28 y.o.   MRN: [id number]  Chief Complaint  Patient presents with  . left wrist injury with pain    Wrist Injury  The incident occurred 5 to 7 days ago. The injury mechanism was a fall. The pain is present in the left wrist. The quality of the pain is described as aching. The pain is at a severity of 10/10 (when moving). The pain is moderate. The pain has been intermittent since the incident. Pertinent negatives include no muscle weakness, numbness or tingling.      Review of Systems  Neurological: Negative for tingling and numbness.  All other systems reviewed and are negative.      Objective:   Physical Exam Vitals signs reviewed.  Constitutional:      General: He is not in acute distress.    Appearance: He is well-developed.  HENT:     Head: Normocephalic.     Right Ear: Tympanic membrane normal.     Left Ear: Tympanic membrane normal.  Eyes:     General:        Right eye: No discharge.        Left eye: No discharge.     Pupils: Pupils are equal, round, and reactive to light.  Neck:     Musculoskeletal: Normal range of motion and neck supple.     Thyroid: No thyromegaly.  Cardiovascular:     Rate and Rhythm: Normal rate and regular rhythm.     Heart sounds: Normal heart sounds. No murmur.  Pulmonary:     Effort: Pulmonary effort is normal. No respiratory distress.     Breath sounds: Normal breath sounds. No wheezing.  Abdominal:     General: Bowel sounds are normal. There is no distension.     Palpations: Abdomen is soft.     Tenderness: There is no abdominal tenderness.  Musculoskeletal:        General: Tenderness present.     Comments: Left medial wrist tenderness and pain with any movement  Skin:    General: Skin is warm and dry.     Findings: No erythema or rash.  Neurological:     Mental Status: He is alert and oriented to person, place, and time.     Cranial Nerves: No cranial  nerve deficit.     Deep Tendon Reflexes: Reflexes are normal and symmetric.  Psychiatric:        Behavior: Behavior normal.        Thought Content: Thought content normal.        Judgment: Judgment normal.       BP 113/79   Pulse 74   Temp 98.7 F (37.1 C) (Oral)   Ht 6' (1.829 m)   Wt 188 lb 3.2 oz (85.4 kg)   BMI 25.52 kg/m      Assessment & Plan:  Martin Stephens comes in today with chief complaint of left wrist injury with pain   Diagnosis and orders addressed:  1. Left wrist pain - DG Wrist Complete Left; Future  2. Closed fracture of left wrist, initial encounter - Ambulatory referral to Orthopedic Surgery  3. Closed nondisplaced fracture of scaphoid of left wrist, unspecified portion of scaphoid, initial encounter   Ortho appt made, patient is leaving to appt now Will hold off on splint at this time and let ortho do this Do not injury   Evelina Dun, FNP

## 2019-04-14 DIAGNOSIS — S62015A Nondisplaced fracture of distal pole of navicular [scaphoid] bone of left wrist, initial encounter for closed fracture: Secondary | ICD-10-CM | POA: Diagnosis not present

## 2020-02-03 IMAGING — DX LEFT WRIST - COMPLETE 3+ VIEW
4 series · 4 of 4 positions shown · non-contrast
Comparison: None.

CLINICAL DATA: Pt fell skateboarding and caught himself on lt hand
c/o pain since 1 week ago

EXAM:
LEFT WRIST - COMPLETE 3+ VIEW

[wrist ap (1 of 3)]
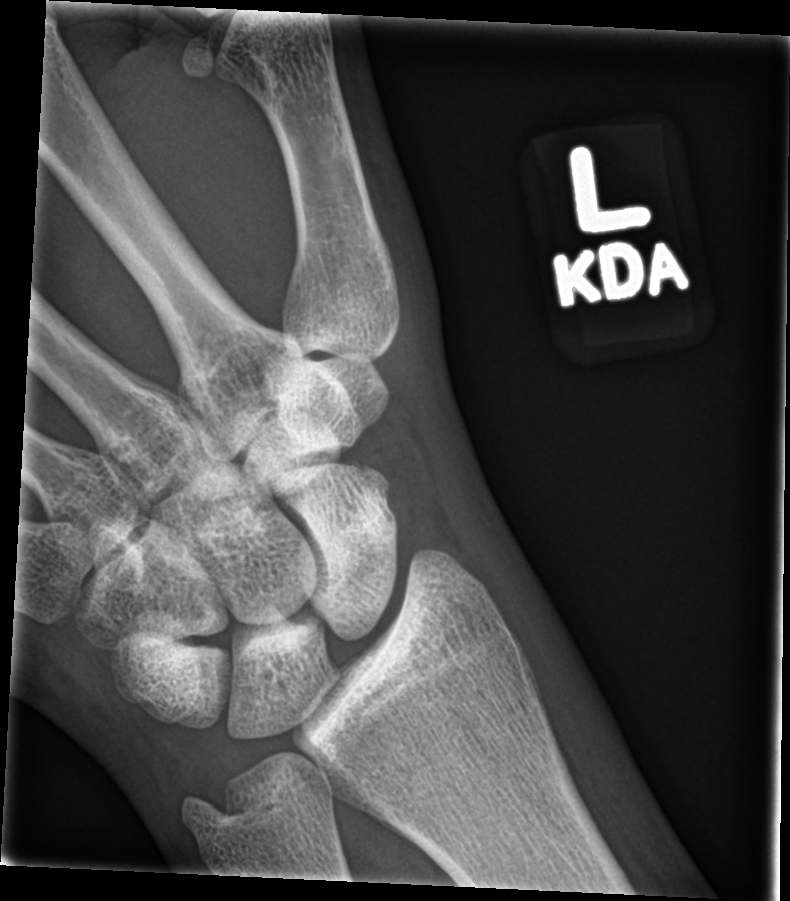

[wrist lat]
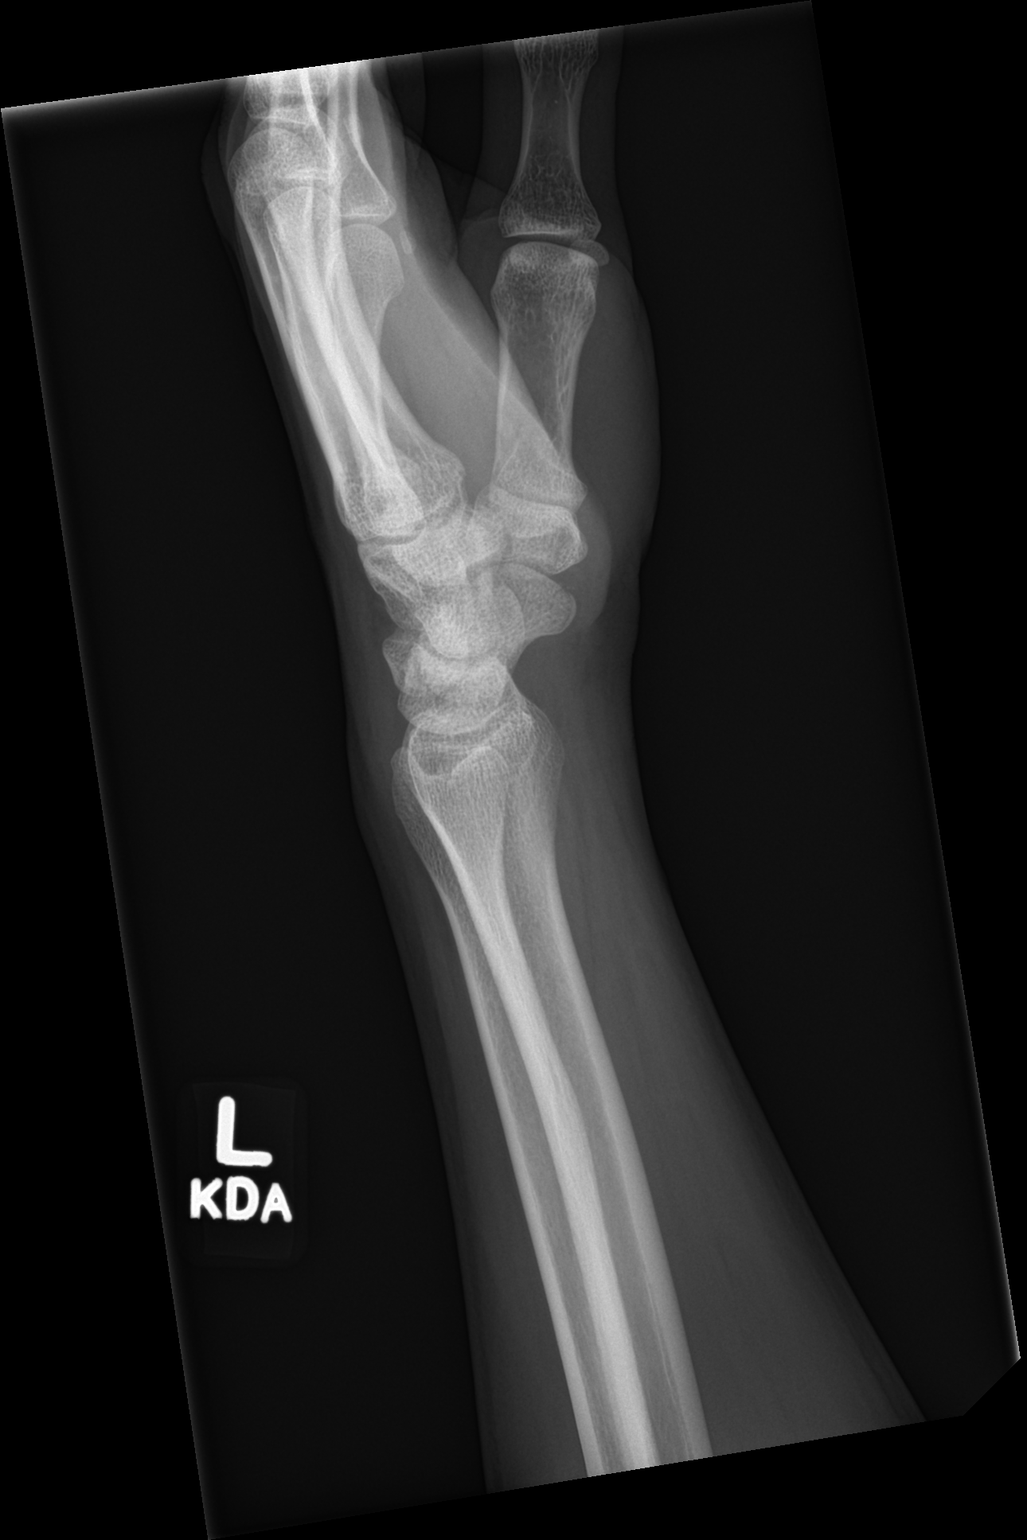

[wrist ap (2 of 3)]
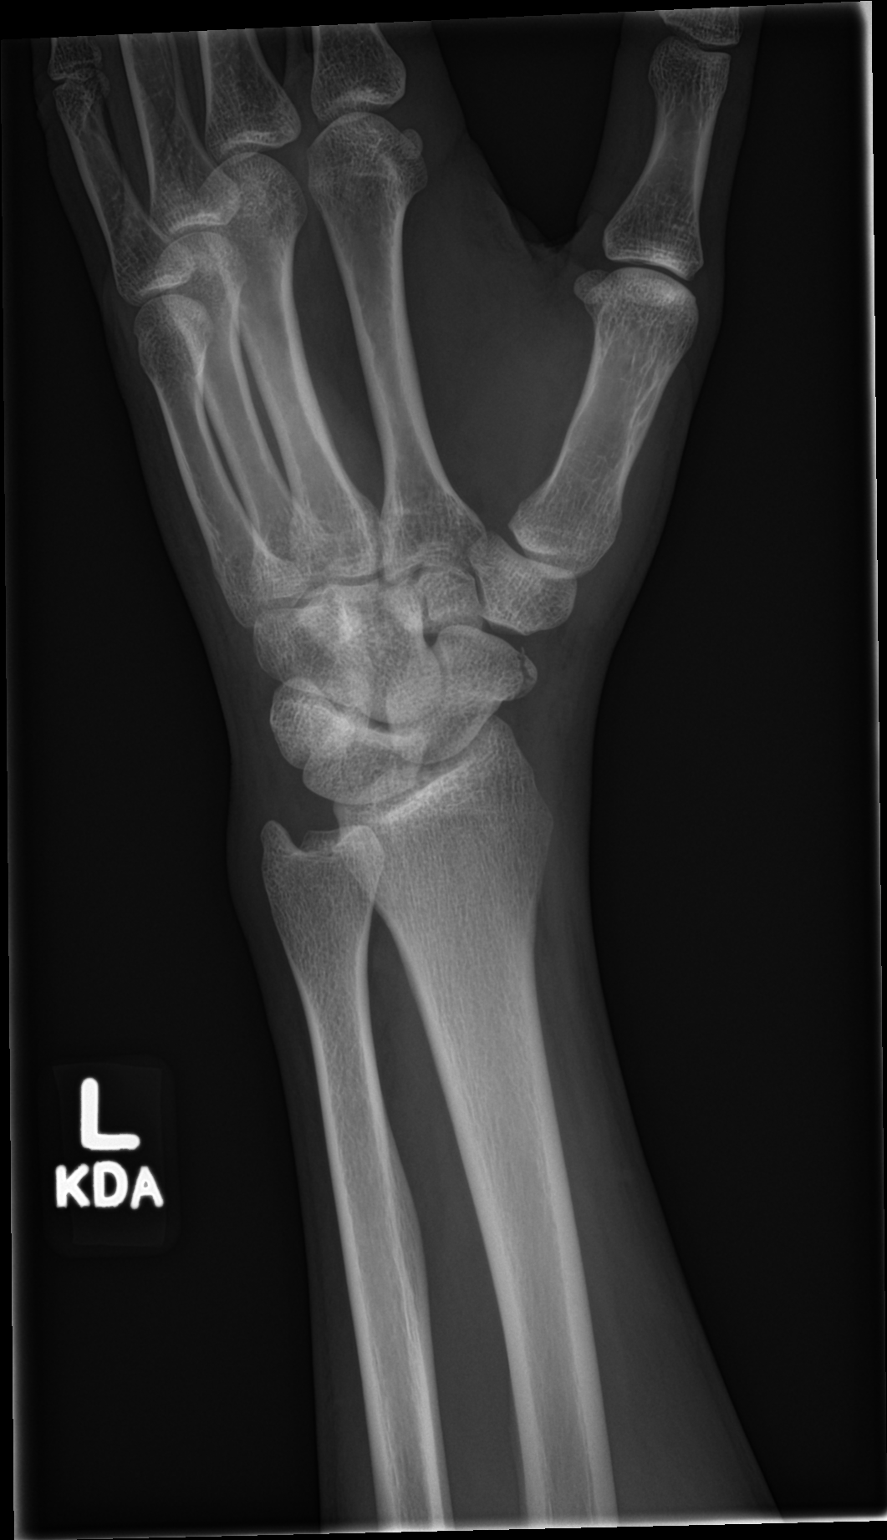

[wrist ap (3 of 3)]
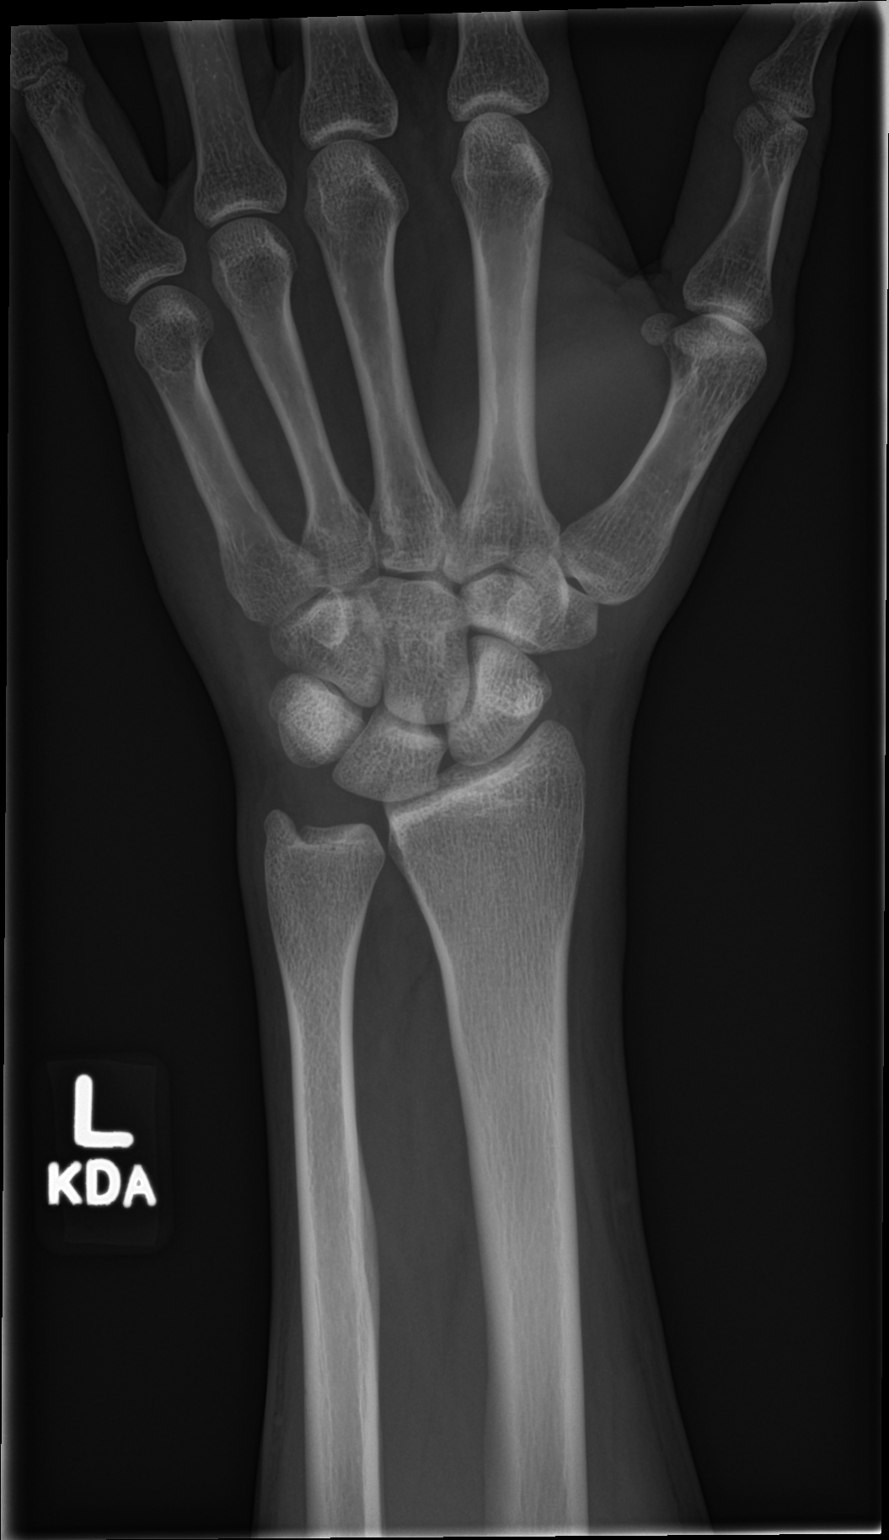

[4 of 4 positions shown; findings below may reference images not displayed]

FINDINGS: Slightly displaced fracture at the distal pole of the LEFT scaphoid
bone. Midpole and lower pole appear intact and normally aligned.

No other osseous fracture seen.

There is slight widening at the radiocarpal aspect of the
scapholunate joint space suggesting ligamentous disruption, with
associated slight lateral displacement/rotation of the lunate bone.
IMPRESSION: 1. Slightly displaced fracture at the distal pole of the LEFT
scaphoid bone.
2. Suspected scapholunate ligament tear with slight widening of the
scapholunate joint space (proximal aspect) and associated mild
displacement/rotation of the lunate bone. The presumed ligament tear
could be confirmed with nonemergent MRI. Orthopedic consultation
recommended further workup considerations.

## 2020-05-07 DIAGNOSIS — M795 Residual foreign body in soft tissue: Secondary | ICD-10-CM | POA: Diagnosis not present

## 2020-05-07 DIAGNOSIS — S50851A Superficial foreign body of right forearm, initial encounter: Secondary | ICD-10-CM | POA: Diagnosis not present

## 2020-05-07 DIAGNOSIS — M79601 Pain in right arm: Secondary | ICD-10-CM | POA: Diagnosis not present

## 2020-05-07 DIAGNOSIS — Z6825 Body mass index (BMI) 25.0-25.9, adult: Secondary | ICD-10-CM | POA: Diagnosis not present

## 2020-05-07 DIAGNOSIS — S59911A Unspecified injury of right forearm, initial encounter: Secondary | ICD-10-CM | POA: Diagnosis not present

## 2020-05-08 DIAGNOSIS — Z6826 Body mass index (BMI) 26.0-26.9, adult: Secondary | ICD-10-CM | POA: Diagnosis not present

## 2020-05-08 DIAGNOSIS — M79601 Pain in right arm: Secondary | ICD-10-CM | POA: Diagnosis not present

## 2020-05-08 DIAGNOSIS — M795 Residual foreign body in soft tissue: Secondary | ICD-10-CM | POA: Diagnosis not present

## 2020-05-11 DIAGNOSIS — Z6826 Body mass index (BMI) 26.0-26.9, adult: Secondary | ICD-10-CM | POA: Diagnosis not present

## 2020-05-11 DIAGNOSIS — S50851A Superficial foreign body of right forearm, initial encounter: Secondary | ICD-10-CM | POA: Diagnosis not present
